# Patient Record
Sex: Female | Born: 1995 | Race: Black or African American | Hispanic: No | Marital: Single | Smoking: Never smoker
Health system: Southern US, Community
[De-identification: ages and names within clinical notes are randomized; demographics above are authoritative.]

## PROBLEM LIST (undated history)

## (undated) DIAGNOSIS — J45909 Unspecified asthma, uncomplicated: Secondary | ICD-10-CM

## (undated) HISTORY — DX: Unspecified asthma, uncomplicated: J45.909

## (undated) HISTORY — PX: WISDOM TOOTH EXTRACTION: SHX21

---

## 2020-10-01 ENCOUNTER — Other Ambulatory Visit: Payer: Self-pay

## 2021-04-23 DIAGNOSIS — Z3490 Encounter for supervision of normal pregnancy, unspecified, unspecified trimester: Secondary | ICD-10-CM | POA: Insufficient documentation

## 2021-04-25 ENCOUNTER — Ambulatory Visit (INDEPENDENT_AMBULATORY_CARE_PROVIDER_SITE_OTHER): Payer: Medicaid Other

## 2021-04-25 DIAGNOSIS — O219 Vomiting of pregnancy, unspecified: Secondary | ICD-10-CM

## 2021-04-25 DIAGNOSIS — Z3A00 Weeks of gestation of pregnancy not specified: Secondary | ICD-10-CM

## 2021-04-25 DIAGNOSIS — Z3490 Encounter for supervision of normal pregnancy, unspecified, unspecified trimester: Secondary | ICD-10-CM

## 2021-04-25 MED ORDER — BLOOD PRESSURE KIT DEVI: 1.0000 | 0 refills | Status: DC

## 2021-04-25 MED ORDER — GOJJI WEIGHT SCALE MISC: 1.0000 | 0 refills | Status: DC

## 2021-04-25 MED ORDER — PROMETHAZINE HCL 25 MG PO TABS: 25.0000 mg | ORAL_TABLET | Freq: Four times a day (QID) | ORAL | 0 refills | Status: DC | PRN

## 2021-04-25 NOTE — Progress Notes (Signed)
New OB Intake  I connected with  Dawn Small on 04/25/21 at  9:00 AM EDT by telephone Video Visit and verified that I am speaking with the correct person using two identifiers. Nurse is located at [facility] and pt is located at HOME.  I discussed the limitations, risks, security and privacy concerns of performing an evaluation and management service by telephone and the availability of in person appointments. I also discussed with the patient that there may be a patient responsible charge related to this service. The patient expressed understanding and agreed to proceed.  I explained I am completing New OB Intake today. We discussed her EDD of 10/22/2021 that is based on LMP of 01/15/2021. Pt is G1/P0. I reviewed her allergies, medications, Medical/Surgical/OB history, and appropriate screenings. I informed her of [facility] services. Based on history, this is a/an  pregnancy complicated by NV  .   Patient Active Problem List   Diagnosis Date Noted   Supervision of normal pregnancy, antepartum 04/23/2021    Concerns addressed today  Delivery Plans:  Plans to deliver at [facility].   MyChart/Babyscripts MyChart access verified. I explained pt will have some visits in office and some virtually. Babyscripts instructions given and order placed. Patient verifies receipt of registration text/e-mail. Account successfully created and app downloaded.  Blood Pressure Cuff  Blood pressure cuff ordered for patient to pick-up from [employer]. Explained after first prenatal appt pt will check weekly and document in Babyscripts.  Weight scale: Patient    have weight scale. Weight scale ordered for patient to pick up form [facility].   Anatomy US Explained first scheduled [place] will be around 19 weeks.  Labs Discussed Avelina Laine genetic screening with patient. Would like both Panorama and Horizon drawn at new OB visit. Routine prenatal labs needed.  Covid Vaccine Patient has covid vaccine.    Mother/ Baby Dyad Candidate?    If yes, offer as possibility  Informed patient of [facility] website  and placed link in her AVS.   Social Determinants of Health Food Insecurity: Patient denies food insecurity. WIC Referral: Patient is interested in referral to [facility].  Transportation: Patient denies transportation needs. Childcare: Discussed no children allowed at ultrasound appointments. Offered childcare services; patient declines childcare services at this time.   Placed OB Box on problem list and updated  First visit review I reviewed new OB appt with pt. I explained she will have a pelvic exam, ob bloodwork with genetic screening, and PAP smear. Explained pt will be seen by Dr. Debroah Loop 05/02/21 at first visit; encounter routed to appropriate provider. Explained that patient will be seen by pregnancy navigator following visit with provider. [facility] information placed in AVS.   Maretta Bees, RMA 04/25/2021  8:46 AM

## 2021-05-02 ENCOUNTER — Other Ambulatory Visit (HOSPITAL_COMMUNITY)
Admission: RE | Admit: 2021-05-02 | Discharge: 2021-05-02 | Disposition: A | Discharge status: Home / Self Care | Payer: Medicaid Other | Source: Ambulatory Visit | Attending: Obstetrics & Gynecology | Admitting: Obstetrics & Gynecology

## 2021-05-02 ENCOUNTER — Ambulatory Visit (INDEPENDENT_AMBULATORY_CARE_PROVIDER_SITE_OTHER): Payer: Medicaid Other | Admitting: Obstetrics & Gynecology

## 2021-05-02 ENCOUNTER — Other Ambulatory Visit: Payer: Self-pay

## 2021-05-02 VITALS — BP 130/84 | HR 116 | Wt 162.8 lb

## 2021-05-02 DIAGNOSIS — Z3A15 15 weeks gestation of pregnancy: Secondary | ICD-10-CM | POA: Diagnosis not present

## 2021-05-02 DIAGNOSIS — Z3492 Encounter for supervision of normal pregnancy, unspecified, second trimester: Secondary | ICD-10-CM

## 2021-05-02 DIAGNOSIS — Z3685 Encounter for antenatal screening for Streptococcus B: Secondary | ICD-10-CM | POA: Diagnosis not present

## 2021-05-02 DIAGNOSIS — Z31430 Encounter of female for testing for genetic disease carrier status for procreative management: Secondary | ICD-10-CM | POA: Diagnosis not present

## 2021-05-02 DIAGNOSIS — Z3490 Encounter for supervision of normal pregnancy, unspecified, unspecified trimester: Secondary | ICD-10-CM | POA: Diagnosis not present

## 2021-05-02 DIAGNOSIS — Z3482 Encounter for supervision of other normal pregnancy, second trimester: Secondary | ICD-10-CM | POA: Diagnosis not present

## 2021-05-02 MED ORDER — ASPIRIN EC 81 MG PO TBEC: 81.0000 mg | DELAYED_RELEASE_TABLET | Freq: Every day | ORAL | 11 refills | Status: DC

## 2021-05-02 NOTE — Progress Notes (Signed)
New OB 15.2wks Intake completed previously. No complaints.

## 2021-05-02 NOTE — Progress Notes (Signed)
  Subjective:    Dawn Small is a G1P0 [redacted]w[redacted]d being seen today for her first obstetrical visit.  Her obstetrical history is significant for  First pregnancy . Patient does not intend to breast feed. Pregnancy history fully reviewed.  Patient reports nausea.  Vitals:   05/02/21 0940  BP: 130/84  Pulse: (!) 116  Weight: 162 lb 12.8 oz (73.8 kg)    HISTORY: OB History  Gravida Para Term Preterm AB Living  1            SAB IAB Ectopic Multiple Live Births               # Outcome Date GA Lbr Len/2nd Weight Sex Delivery Anes PTL Lv  1 Current            Past Medical History:  Diagnosis Date  . Asthma    No past surgical history on file. Family History  Problem Relation Age of Onset  . Cancer Maternal Aunt      Exam    Uterus:   15 week  Pelvic Exam:    Perineum: No Hemorrhoids   Vulva: normal   Vagina:  curdlike discharge   pH:    Cervix: no lesions   Adnexa: normal adnexa   Bony Pelvis: average  System: Breast:  normal appearance, no masses or tenderness, piercings   Skin: normal coloration and turgor, no rashes    Neurologic: oriented, normal mood   Extremities: normal strength, tone, and muscle mass   HEENT PERRLA and oropharynx clear, no lesions   Mouth/Teeth mucous membranes moist, pharynx normal without lesions and dental hygiene good   Neck supple and no masses   Cardiovascular: regular rate and rhythm, no murmurs or gallops   Respiratory:  appears well, vitals normal, no respiratory distress, acyanotic, normal RR, neck free of mass or lymphadenopathy, chest clear, no wheezing, crepitations, rhonchi, normal symmetric air entry   Abdomen: soft, non-tender; bowel sounds normal; no masses,  no organomegaly   Urinary: urethral meatus normal      Assessment:    Pregnancy: G1P0 Patient Active Problem List   Diagnosis Date Noted  . Supervision of normal pregnancy, antepartum 04/23/2021        Plan:     Initial labs drawn. Prenatal  vitamins. Problem list reviewed and updated. Genetic Screening discussed : ordered  Ultrasound discussed; fetal survey: ordered.  Follow up in 4 weeks. 50% of 30 min visit spent on counseling and coordination of care.  Anatomy scan. ASA 81 mg BP 130.84  Scheryl Darter 05/02/2021

## 2021-05-03 LAB — CBC/D/PLT+RPR+RH+ABO+RUBIGG...
Antibody Screen: NEGATIVE
Basophils Absolute: 0 10*3/uL (ref 0.0–0.2)
Basos: 0 %
EOS (ABSOLUTE): 0.2 10*3/uL (ref 0.0–0.4)
Eos: 2 %
HCV Ab: <0.1 s/co ratio (ref 0.0–0.9)
HIV Screen 4th Generation wRfx: NONREACTIVE
Hematocrit: 33.9 % — ABNORMAL LOW (ref 34.0–46.6)
Hemoglobin: 9.7 g/dL — ABNORMAL LOW (ref 11.1–15.9)
Hepatitis B Surface Ag: NEGATIVE
Immature Grans (Abs): 0 10*3/uL (ref 0.0–0.1)
Immature Granulocytes: 0 %
Lymphocytes Absolute: 1.9 10*3/uL (ref 0.7–3.1)
Lymphs: 19 %
MCH: 18.7 pg — ABNORMAL LOW (ref 26.6–33.0)
MCHC: 28.6 g/dL — ABNORMAL LOW (ref 31.5–35.7)
MCV: 65 fL — ABNORMAL LOW (ref 79–97)
Monocytes Absolute: 0.9 10*3/uL (ref 0.1–0.9)
Monocytes: 9 %
Neutrophils Absolute: 6.9 10*3/uL (ref 1.4–7.0)
Neutrophils: 70 %
Platelets: 363 10*3/uL (ref 150–450)
RBC: 5.2 x10E6/uL (ref 3.77–5.28)
RDW: 20 % — ABNORMAL HIGH (ref 11.7–15.4)
RPR Ser Ql: NONREACTIVE
Rh Factor: POSITIVE
Rubella Antibodies, IGG: 3.21 index (ref >0.99)
WBC: 9.9 10*3/uL (ref 3.4–10.8)

## 2021-05-03 LAB — HEPATITIS C ANTIBODY: Hep C Virus Ab: <0.1 s/co ratio (ref 0.0–0.9)

## 2021-05-03 LAB — HCV INTERPRETATION

## 2021-05-05 LAB — CERVICOVAGINAL ANCILLARY ONLY
Bacterial Vaginitis (gardnerella): POSITIVE — AB
Candida Glabrata: NEGATIVE
Candida Vaginitis: POSITIVE — AB
Chlamydia: POSITIVE — AB
Comment: NEGATIVE
Comment: NEGATIVE
Comment: NEGATIVE
Comment: NEGATIVE
Comment: NEGATIVE
Comment: NORMAL
Neisseria Gonorrhea: NEGATIVE
Trichomonas: NEGATIVE

## 2021-05-05 LAB — CYTOLOGY - PAP: Diagnosis: NEGATIVE

## 2021-05-08 ENCOUNTER — Encounter (HOSPITAL_COMMUNITY): Payer: Self-pay | Admitting: Emergency Medicine

## 2021-05-08 ENCOUNTER — Other Ambulatory Visit: Payer: Self-pay | Admitting: Obstetrics & Gynecology

## 2021-05-08 ENCOUNTER — Inpatient Hospital Stay (HOSPITAL_COMMUNITY)
Admission: EM | Admit: 2021-05-08 | Discharge: 2021-05-09 | Disposition: A | Discharge status: Home / Self Care | Payer: Medicaid Other | Attending: Obstetrics & Gynecology | Admitting: Obstetrics & Gynecology

## 2021-05-08 ENCOUNTER — Other Ambulatory Visit: Payer: Self-pay

## 2021-05-08 DIAGNOSIS — O99282 Endocrine, nutritional and metabolic diseases complicating pregnancy, second trimester: Secondary | ICD-10-CM | POA: Diagnosis not present

## 2021-05-08 DIAGNOSIS — O2342 Unspecified infection of urinary tract in pregnancy, second trimester: Secondary | ICD-10-CM | POA: Diagnosis not present

## 2021-05-08 DIAGNOSIS — O211 Hyperemesis gravidarum with metabolic disturbance: Secondary | ICD-10-CM | POA: Diagnosis not present

## 2021-05-08 DIAGNOSIS — A749 Chlamydial infection, unspecified: Secondary | ICD-10-CM

## 2021-05-08 DIAGNOSIS — O26892 Other specified pregnancy related conditions, second trimester: Secondary | ICD-10-CM | POA: Insufficient documentation

## 2021-05-08 DIAGNOSIS — O219 Vomiting of pregnancy, unspecified: Secondary | ICD-10-CM | POA: Insufficient documentation

## 2021-05-08 DIAGNOSIS — E860 Dehydration: Secondary | ICD-10-CM | POA: Diagnosis not present

## 2021-05-08 DIAGNOSIS — Z3A16 16 weeks gestation of pregnancy: Secondary | ICD-10-CM | POA: Diagnosis not present

## 2021-05-08 DIAGNOSIS — N3001 Acute cystitis with hematuria: Secondary | ICD-10-CM | POA: Diagnosis not present

## 2021-05-08 DIAGNOSIS — R42 Dizziness and giddiness: Secondary | ICD-10-CM | POA: Insufficient documentation

## 2021-05-08 DIAGNOSIS — Z79899 Other long term (current) drug therapy: Secondary | ICD-10-CM | POA: Diagnosis not present

## 2021-05-08 DIAGNOSIS — O23592 Infection of other part of genital tract in pregnancy, second trimester: Secondary | ICD-10-CM | POA: Insufficient documentation

## 2021-05-08 DIAGNOSIS — Z3A15 15 weeks gestation of pregnancy: Secondary | ICD-10-CM | POA: Diagnosis not present

## 2021-05-08 LAB — URINE CULTURE, OB REFLEX

## 2021-05-08 LAB — URINALYSIS, ROUTINE W REFLEX MICROSCOPIC
Bilirubin Urine: NEGATIVE
Glucose, UA: NEGATIVE mg/dL
Hgb urine dipstick: NEGATIVE
Ketones, ur: 80 mg/dL — AB
Nitrite: NEGATIVE
Protein, ur: 100 mg/dL — AB
Specific Gravity, Urine: 1.025 (ref 1.005–1.030)
WBC, UA: >50 WBC/hpf — ABNORMAL HIGH (ref 0–5)
pH: 8 (ref 5.0–8.0)

## 2021-05-08 LAB — I-STAT BETA HCG BLOOD, ED (MC, WL, AP ONLY): I-stat hCG, quantitative: >2000 m[IU]/mL — ABNORMAL HIGH (ref <5)

## 2021-05-08 LAB — CULTURE, OB URINE

## 2021-05-08 MED ORDER — FLUCONAZOLE 150 MG PO TABS: 150.0000 mg | ORAL_TABLET | Freq: Once | ORAL | 0 refills | Status: DC

## 2021-05-08 MED ORDER — METRONIDAZOLE 500 MG PO TABS: 500.0000 mg | ORAL_TABLET | Freq: Two times a day (BID) | ORAL | 0 refills | Status: AC

## 2021-05-08 MED ORDER — AZITHROMYCIN 500 MG PO TABS: 1000.0000 mg | ORAL_TABLET | Freq: Once | ORAL | 0 refills | Status: DC

## 2021-05-08 NOTE — ED Triage Notes (Addendum)
Patient is [redacted] weeks pregnant G1P0 , reports feeling lightheaded/dizzy today and hypotensive 100/38. Denies abdominal cramping or vaginal bleeding . Normotensive at triage.

## 2021-05-08 NOTE — ED Provider Notes (Signed)
Emergency Medicine Provider OB Triage Evaluation Note  Dawn Small is a 25 y.o. female, G1P0, at [redacted]w[redacted]d gestation who presents to the emergency department with complaints of lightheadedness.  She took her blood pressure and noticed that her systolic pressures were about 711 earlier today.  She states I just want to know why." Review of  Systems  Positive: Lightheadedness Negative: Fever  Physical Exam  BP 120/80 (BP Location: Right Arm)   Pulse 97   Temp 98.3 F (36.8 C)   Resp 18   Ht 5\' 3"  (1.6 m)   Wt 80 kg   LMP 01/15/2021 (Approximate)   SpO2 100%   BMI 31.24 kg/m  General: Awake, no distress  HEENT: Atraumatic  Resp: Normal effort  Cardiac: Normal rate Abd: Nondistended, nontender  MSK: Moves all extremities without difficulty Neuro: Speech clear  Medical Decision Making  Pt evaluated for pregnancy concern and is stable for transfer to MAU. Pt is in agreement with plan for transfer.  8:30 PM Discussed with MAU APP, 01/17/2021, who accepts patient in transfer.  Clinical Impression  No diagnosis found.     Thalia Bloodgood, PA-C 05/08/21 2140    07/08/21, DO 05/08/21 2204

## 2021-05-08 NOTE — MAU Note (Signed)
Yesterday I had a headache and then today I felt dizzy all day. Was off balance and stumbling some. Has vomited several times today. Took nausea med (promethazine)this am and none since. No VB or dc. Intermittent pain RLQ for 3 days but none now.

## 2021-05-08 NOTE — ED Notes (Signed)
Report given to MAU RN , PA evaluated patient at ER .

## 2021-05-08 NOTE — Progress Notes (Signed)
Meds ordered this encounter  Medications   azithromycin (ZITHROMAX) 500 MG tablet    Sig: Take 2 tablets (1,000 mg total) by mouth once for 1 dose.    Dispense:  2 tablet    Refill:  0   fluconazole (DIFLUCAN) 150 MG tablet    Sig: Take 1 tablet (150 mg total) by mouth once for 1 dose.    Dispense:  1 tablet    Refill:  0   metroNIDAZOLE (FLAGYL) 500 MG tablet    Sig: Take 1 tablet (500 mg total) by mouth 2 (two) times daily for 7 days.    Dispense:  14 tablet    Refill:  0

## 2021-05-09 ENCOUNTER — Encounter (HOSPITAL_COMMUNITY): Payer: Self-pay | Admitting: Obstetrics & Gynecology

## 2021-05-09 DIAGNOSIS — Z3A16 16 weeks gestation of pregnancy: Secondary | ICD-10-CM | POA: Diagnosis not present

## 2021-05-09 DIAGNOSIS — O99282 Endocrine, nutritional and metabolic diseases complicating pregnancy, second trimester: Secondary | ICD-10-CM

## 2021-05-09 DIAGNOSIS — O2342 Unspecified infection of urinary tract in pregnancy, second trimester: Secondary | ICD-10-CM

## 2021-05-09 DIAGNOSIS — O211 Hyperemesis gravidarum with metabolic disturbance: Secondary | ICD-10-CM | POA: Diagnosis not present

## 2021-05-09 DIAGNOSIS — E860 Dehydration: Secondary | ICD-10-CM | POA: Diagnosis not present

## 2021-05-09 LAB — BASIC METABOLIC PANEL
Anion gap: 11 (ref 5–15)
BUN: 8 mg/dL (ref 6–20)
CO2: 23 mmol/L (ref 22–32)
Calcium: 9.2 mg/dL (ref 8.9–10.3)
Chloride: 100 mmol/L (ref 98–111)
Creatinine, Ser: 0.77 mg/dL (ref 0.44–1.00)
GFR, Estimated: >60 mL/min (ref >60)
Glucose, Bld: 119 mg/dL — ABNORMAL HIGH (ref 70–99)
Potassium: 3.7 mmol/L (ref 3.5–5.1)
Sodium: 134 mmol/L — ABNORMAL LOW (ref 135–145)

## 2021-05-09 LAB — CBC
HCT: 33.2 % — ABNORMAL LOW (ref 36.0–46.0)
Hemoglobin: 9.8 g/dL — ABNORMAL LOW (ref 12.0–15.0)
MCH: 19.1 pg — ABNORMAL LOW (ref 26.0–34.0)
MCHC: 29.5 g/dL — ABNORMAL LOW (ref 30.0–36.0)
MCV: 64.7 fL — ABNORMAL LOW (ref 80.0–100.0)
Platelets: 385 10*3/uL (ref 150–400)
RBC: 5.13 MIL/uL — ABNORMAL HIGH (ref 3.87–5.11)
RDW: 20.1 % — ABNORMAL HIGH (ref 11.5–15.5)
WBC: 14.7 10*3/uL — ABNORMAL HIGH (ref 4.0–10.5)
nRBC: 0 % (ref 0.0–0.2)

## 2021-05-09 MED ORDER — CEPHALEXIN 500 MG PO CAPS: 500.0000 mg | ORAL_CAPSULE | Freq: Four times a day (QID) | ORAL | 2 refills | Status: DC

## 2021-05-09 MED ORDER — ONDANSETRON HCL 4 MG/2ML IJ SOLN
4.0000 mg | Freq: Once | INTRAMUSCULAR | Status: AC
  Administered 2021-05-09: 4 mg via INTRAVENOUS
  Filled 2021-05-09: qty 2

## 2021-05-09 MED ORDER — FLUCONAZOLE 150 MG PO TABS: 150.0000 mg | ORAL_TABLET | Freq: Once | ORAL | 0 refills | Status: AC

## 2021-05-09 MED ORDER — ONDANSETRON 4 MG PO TBDP: 4.0000 mg | ORAL_TABLET | Freq: Four times a day (QID) | ORAL | 0 refills | Status: DC | PRN

## 2021-05-09 MED ORDER — SODIUM CHLORIDE 0.9 % IV SOLN
2.0000 g | Freq: Once | INTRAVENOUS | Status: AC
  Administered 2021-05-09: 2 g via INTRAVENOUS
  Filled 2021-05-09: qty 2

## 2021-05-09 MED ORDER — SODIUM CHLORIDE 0.9 % IV SOLN: INTRAVENOUS | Status: AC

## 2021-05-09 MED ORDER — AZITHROMYCIN 500 MG PO TABS
1000.0000 mg | ORAL_TABLET | Freq: Once | ORAL | 0 refills | Status: AC
Start: 2021-05-09 — End: 2021-05-09

## 2021-05-09 MED ORDER — LACTATED RINGERS IV SOLN: Freq: Once | INTRAVENOUS | Status: DC

## 2021-05-09 NOTE — MAU Provider Note (Signed)
Chief Complaint:  DizzyLlightheaded ([redacted] weeks pregnant)   Event Date/Time   First Provider Initiated Contact with Patient 05/09/21 0044     HPI: Dawn Small is a 25 y.o. G1P0 at 42w2dwho presents to maternity admissions reporting nausea and vomiting.  Took phenergan once in AM.  Tries to drink but vomits it up.   She denies LOF, vaginal bleeding, vaginal itching/burning, urinary symptoms, h/a, dizziness, diarrhea, constipation or fever/chills.  .  Emesis  This is a recurrent problem. The problem has been unchanged. There has been no fever. Associated symptoms include dizziness. Pertinent negatives include no abdominal pain, chills, coughing, diarrhea, fever or myalgias. Treatments tried: phenergan. The treatment provided mild relief.   RN Note: Yesterday I had a headache and then today I felt dizzy all day. Was off balance and stumbling some. Has vomited several times today. Took nausea med (promethazine)this am and none since. No VB or dc. Intermittent pain RLQ for 3 days but none now.   Past Medical History: Past Medical History:  Diagnosis Date   Asthma     Past obstetric history: OB History  Gravida Para Term Preterm AB Living  1            SAB IAB Ectopic Multiple Live Births               # Outcome Date GA Lbr Len/2nd Weight Sex Delivery Anes PTL Lv  1 Current             Past Surgical History: History reviewed. No pertinent surgical history.  Family History: Family History  Problem Relation Age of Onset   Cancer Maternal Aunt     Social History: Social History   Tobacco Use   Smoking status: Never   Smokeless tobacco: Never  Vaping Use   Vaping Use: Never used  Substance Use Topics   Alcohol use: Yes    Comment: rarely   Drug use: Never    Allergies: No Known Allergies  Meds:  Medications Prior to Admission  Medication Sig Dispense Refill Last Dose   [EXPIRED] azithromycin (ZITHROMAX) 500 MG tablet Take 2 tablets (1,000 mg total) by mouth once for 1  dose. 2 tablet 0    Blood Pressure Monitoring (BLOOD PRESSURE KIT) DEVI 1 kit by Does not apply route once a week. Check Blood Pressure regularly and record readings into the Babyscripts App.  Large Cuff.  DX O90.0 1 each 0    [EXPIRED] fluconazole (DIFLUCAN) 150 MG tablet Take 1 tablet (150 mg total) by mouth once for 1 dose. 1 tablet 0    metroNIDAZOLE (FLAGYL) 500 MG tablet Take 1 tablet (500 mg total) by mouth 2 (two) times daily for 7 days. 14 tablet 0    Misc. Devices (GOJJI WEIGHT SCALE) MISC 1 Device by Does not apply route once a week. 1 each 0    Prenatal Vit-Fe Fumarate-FA (MULTIVITAMIN-PRENATAL) 27-0.8 MG TABS tablet Take 1 tablet by mouth daily at 12 noon.      promethazine (PHENERGAN) 25 MG tablet Take 1 tablet (25 mg total) by mouth every 6 (six) hours as needed for nausea or vomiting. 30 tablet 0     I have reviewed patient's Past Medical Hx, Surgical Hx, Family Hx, Social Hx, medications and allergies.   ROS:  Review of Systems  Constitutional:  Negative for chills and fever.  Respiratory:  Negative for cough.   Gastrointestinal:  Positive for vomiting. Negative for abdominal pain and diarrhea.  Musculoskeletal:  Negative for myalgias.  Neurological:  Positive for dizziness.  Other systems negative  Physical Exam  Patient Vitals for the past 24 hrs:  BP Temp Pulse Resp SpO2 Height Weight  05/08/21 2204 115/74 98.3 F (36.8 C) 98 16 100 % $Rem'5\' 3"'rNVt$  (1.6 m) 72.6 kg  05/08/21 2014 120/80 98.3 F (36.8 C) 97 18 100 % -- --  05/08/21 2014 -- -- -- -- -- $Rem'5\' 3"'nNYm$  (1.6 m) 80 kg   Constitutional: Well-developed, well-nourished female in no acute distress.  Cardiovascular: normal rate and rhythm Respiratory: normal effort GI: Abd soft, non-tender, gravid appropriate for gestational age.   No rebound or guarding. MS: Extremities nontender, no edema, normal ROM Neurologic: Alert and oriented x 4.  GU: Neg CVAT.  PELVIC EXAM: deferred   FHT:  151   Labs: Results for orders  placed or performed during the hospital encounter of 05/08/21 (from the past 24 hour(s))  I-Stat beta hCG blood, ED     Status: Abnormal   Collection Time: 05/08/21  9:18 PM  Result Value Ref Range   I-stat hCG, quantitative >2,000.0 (H) <5 mIU/mL   Comment 3          Urinalysis, Routine w reflex microscopic Urine, Clean Catch     Status: Abnormal   Collection Time: 05/08/21 10:13 PM  Result Value Ref Range   Color, Urine AMBER (A) YELLOW   APPearance CLOUDY (A) CLEAR   Specific Gravity, Urine 1.025 1.005 - 1.030   pH 8.0 5.0 - 8.0   Glucose, UA NEGATIVE NEGATIVE mg/dL   Hgb urine dipstick NEGATIVE NEGATIVE   Bilirubin Urine NEGATIVE NEGATIVE   Ketones, ur 80 (A) NEGATIVE mg/dL   Protein, ur 100 (A) NEGATIVE mg/dL   Nitrite NEGATIVE NEGATIVE   Leukocytes,Ua LARGE (A) NEGATIVE   RBC / HPF 0-5 0 - 5 RBC/hpf   WBC, UA >50 (H) 0 - 5 WBC/hpf   Bacteria, UA MANY (A) NONE SEEN   Squamous Epithelial / LPF 11-20 0 - 5   Mucus PRESENT   Basic metabolic panel     Status: Abnormal   Collection Time: 05/09/21  1:00 AM  Result Value Ref Range   Sodium 134 (L) 135 - 145 mmol/L   Potassium 3.7 3.5 - 5.1 mmol/L   Chloride 100 98 - 111 mmol/L   CO2 23 22 - 32 mmol/L   Glucose, Bld 119 (H) 70 - 99 mg/dL   BUN 8 6 - 20 mg/dL   Creatinine, Ser 0.77 0.44 - 1.00 mg/dL   Calcium 9.2 8.9 - 10.3 mg/dL   GFR, Estimated >60 >60 mL/min   Anion gap 11 5 - 15  CBC     Status: Abnormal   Collection Time: 05/09/21  1:00 AM  Result Value Ref Range   WBC 14.7 (H) 4.0 - 10.5 K/uL   RBC 5.13 (H) 3.87 - 5.11 MIL/uL   Hemoglobin 9.8 (L) 12.0 - 15.0 g/dL   HCT 33.2 (L) 36.0 - 46.0 %   MCV 64.7 (L) 80.0 - 100.0 fL   MCH 19.1 (L) 26.0 - 34.0 pg   MCHC 29.5 (L) 30.0 - 36.0 g/dL   RDW 20.1 (H) 11.5 - 15.5 %   Platelets 385 150 - 400 K/uL   nRBC 0.0 0.0 - 0.2 %    A/Positive/-- (07/29 1054)  Imaging:  No results found.  MAU Course/MDM: I have ordered labs and reviewed results. These are normal except  for UA which indicated infection.  Already being treated for Chlamydia  and BV   Treatments in MAU included IV hydration with Zofran. We also gave a dose of Rocephin for UTI .    Assessment: Single IUP at [redacted]w[redacted]d Nausea and vomiting Dehydration UTI  Plan: Discharge home Rx Keflex for UTI Rx Zofran for nausea Advance diet as tolerated Follow up in Office for prenatal visits  Encouraged to return if she develops worsening of symptoms, increase in pain, fever, or other concerning symptoms  Pt stable at time of discharge.  Hansel Feinstein CNM, MSN Certified Nurse-Midwife 05/09/2021 12:46 AM

## 2021-05-12 ENCOUNTER — Encounter: Payer: Self-pay | Admitting: Obstetrics & Gynecology

## 2021-05-15 ENCOUNTER — Encounter: Payer: Self-pay | Admitting: Obstetrics & Gynecology

## 2021-05-28 ENCOUNTER — Other Ambulatory Visit: Payer: Self-pay

## 2021-05-28 ENCOUNTER — Ambulatory Visit: Payer: Medicaid Other | Attending: Obstetrics & Gynecology

## 2021-05-28 ENCOUNTER — Other Ambulatory Visit: Payer: Self-pay | Admitting: Obstetrics & Gynecology

## 2021-05-28 DIAGNOSIS — Z3490 Encounter for supervision of normal pregnancy, unspecified, unspecified trimester: Secondary | ICD-10-CM | POA: Diagnosis not present

## 2021-05-30 ENCOUNTER — Encounter: Payer: Self-pay | Admitting: Obstetrics and Gynecology

## 2021-05-30 ENCOUNTER — Other Ambulatory Visit: Payer: Self-pay | Admitting: *Deleted

## 2021-05-30 ENCOUNTER — Ambulatory Visit (INDEPENDENT_AMBULATORY_CARE_PROVIDER_SITE_OTHER): Payer: Medicaid Other | Admitting: Obstetrics and Gynecology

## 2021-05-30 ENCOUNTER — Other Ambulatory Visit: Payer: Self-pay

## 2021-05-30 DIAGNOSIS — D563 Thalassemia minor: Secondary | ICD-10-CM

## 2021-05-30 DIAGNOSIS — Z362 Encounter for other antenatal screening follow-up: Secondary | ICD-10-CM

## 2021-05-30 DIAGNOSIS — Z3490 Encounter for supervision of normal pregnancy, unspecified, unspecified trimester: Secondary | ICD-10-CM

## 2021-05-30 NOTE — Progress Notes (Signed)
   PRENATAL VISIT NOTE  Subjective:  Dawn Small is a 25 y.o. [id number] at [redacted]w[redacted]d beilow-riskng seen today for ongoing prenatal care.  She is currently monitored for the following issues for this low-risk pregnancy and has Supervision of normal pregnancy, antepartum and Alpha thalassemia trait on their problem list.  Patient reports no complaints.  Contractions: Not present. Vag. Bleeding: None.   . Denies leaking of fluid.   The following portions of the patient's history were reviewed and updated as appropriate: allergies, current medications, past family history, past medical history, past social history, past surgical history and problem list.   Objective:   Vitals:   05/30/21 1045  BP: 111/65  Pulse: 92  Weight: 164 lb 12.8 oz (74.8 kg)    Fetal Status: Fetal Heart Rate (bpm): 150         General:  Alert, oriented and cooperative. Patient is in no acute distress.  Skin: Skin is warm and dry. No rash noted.   Cardiovascular: Normal heart rate noted  Respiratory: Normal respiratory effort, no problems with respiration noted  Abdomen: Soft, gravid, appropriate for gestational age.  Pain/Pressure: Present     Pelvic: Cervical exam deferred        Extremities: Normal range of motion.  Edema: None  Mental Status: Normal mood and affect. Normal behavior. Normal judgment and thought content.   Assessment and Plan:  Pregnancy: G5P0040 at [redacted]w[redacted]d 1. Encounter for supervision of normal pregnancy, antepartum, unspecified gravidity  Doing well  Patient left prior to AFP drawn   2. Alpha thalassemia trait  Discussed results in detail Offered genetic counselor- patient declined.   Preterm labor symptoms and general obstetric precautions including but not limited to vaginal bleeding, contractions, leaking of fluid and fetal movement were reviewed in detail with the patient. Please refer to After Visit Summary for other counseling recommendations.   No follow-ups on file.  Future  Appointments  Date Time Provider Department Center  06/25/2021 12:30 PM [facility] NURSE [facility]  06/25/2021 12:45 PM [facility]  06/27/2021 11:15 AM Constant, Gigi Gin, MD [facility] None    Venia Carbon, NP

## 2021-05-30 NOTE — Progress Notes (Signed)
Pt reports fetal movement, denies pain.  

## 2021-06-25 ENCOUNTER — Ambulatory Visit: Payer: Medicaid Other | Attending: Maternal & Fetal Medicine

## 2021-06-25 ENCOUNTER — Other Ambulatory Visit: Payer: Self-pay

## 2021-06-25 ENCOUNTER — Encounter: Payer: Self-pay | Admitting: *Deleted

## 2021-06-25 ENCOUNTER — Ambulatory Visit: Payer: Medicaid Other | Admitting: *Deleted

## 2021-06-25 VITALS — BP 104/62 | HR 102

## 2021-06-25 DIAGNOSIS — Z3490 Encounter for supervision of normal pregnancy, unspecified, unspecified trimester: Secondary | ICD-10-CM | POA: Diagnosis present

## 2021-06-25 DIAGNOSIS — D563 Thalassemia minor: Secondary | ICD-10-CM | POA: Diagnosis present

## 2021-06-25 DIAGNOSIS — Z362 Encounter for other antenatal screening follow-up: Secondary | ICD-10-CM | POA: Diagnosis not present

## 2021-06-25 DIAGNOSIS — Z3A23 23 weeks gestation of pregnancy: Secondary | ICD-10-CM

## 2021-06-25 DIAGNOSIS — O2622 Pregnancy care for patient with recurrent pregnancy loss, second trimester: Secondary | ICD-10-CM | POA: Diagnosis not present

## 2021-06-25 IMAGING — US US MFM OB FOLLOW-UP
1 series · 14 of 28 positions shown · non-contrast
Comparison: none

[Series 1: us mfm ob follow-up · 14 of 43 slices shown]
[im 2/43]
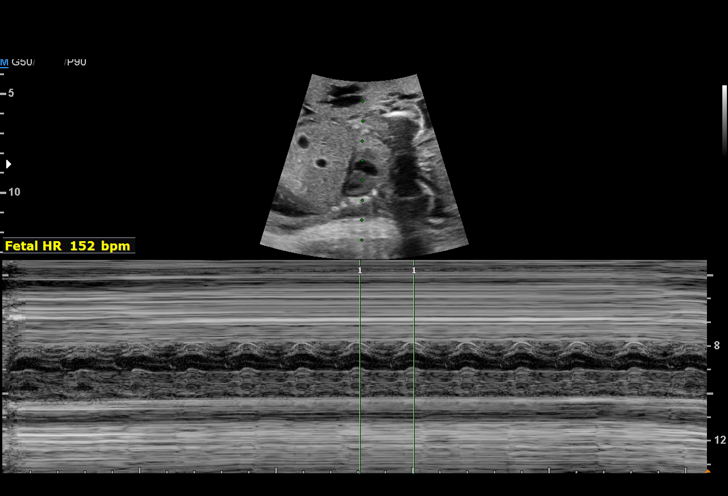
[im 5/43]
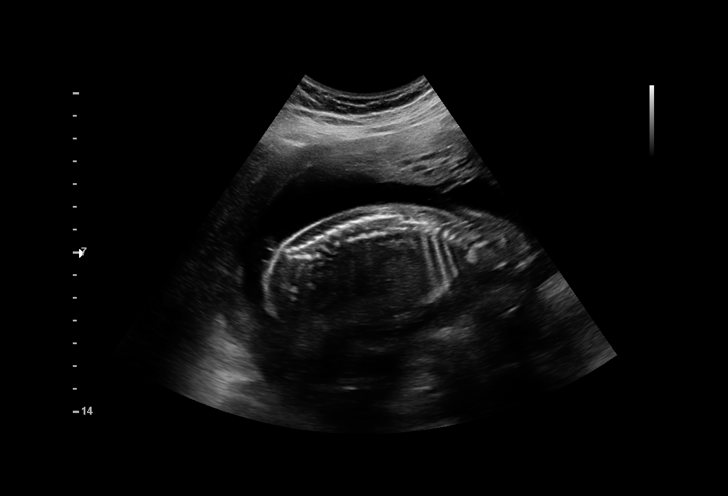
[im 8/43]
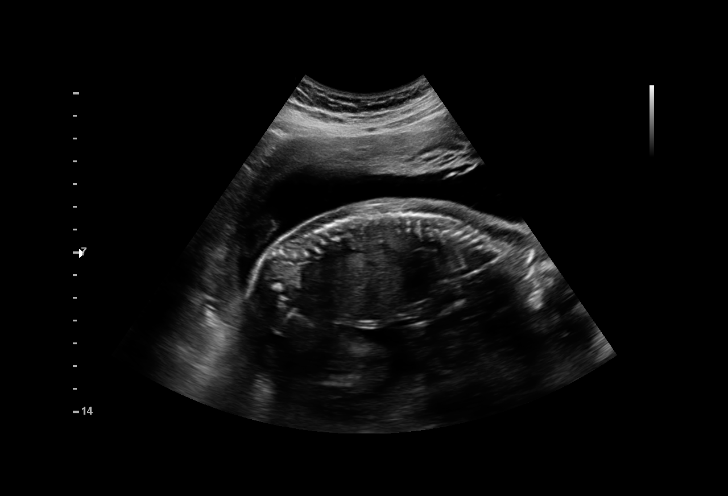
[im 11/43]
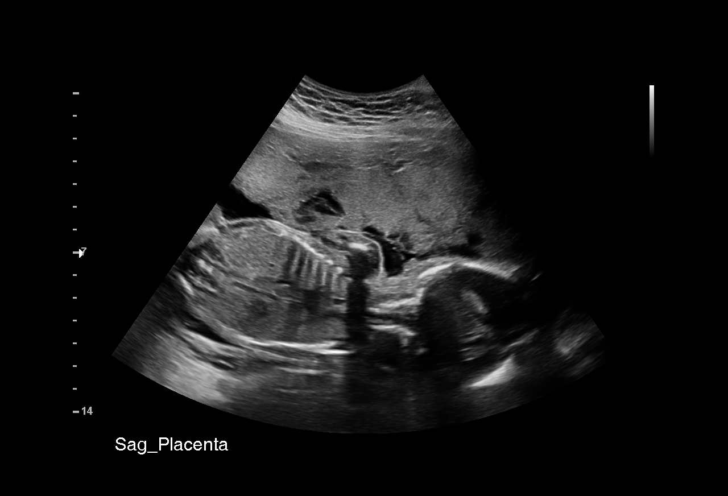
[im 15/43]
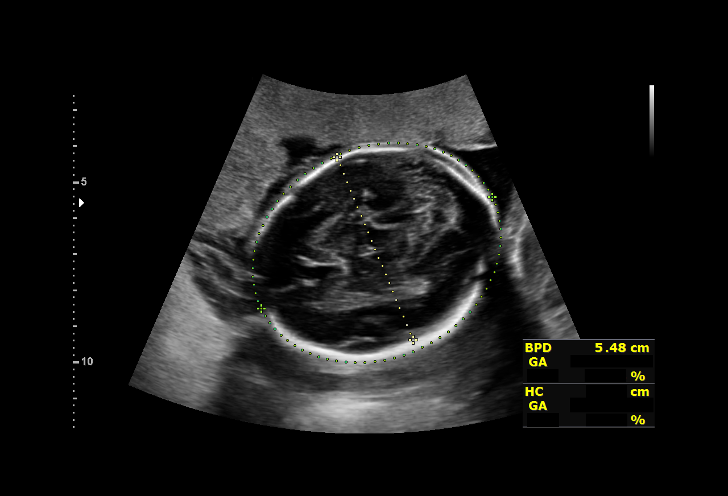
[im 18/43]
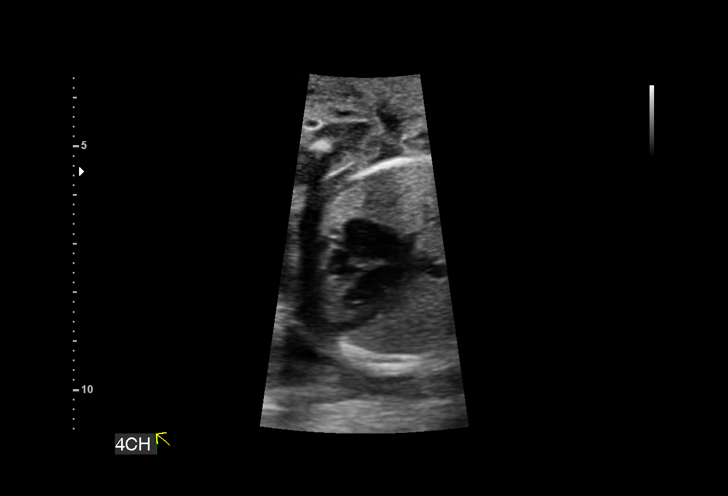
[im 21/43]
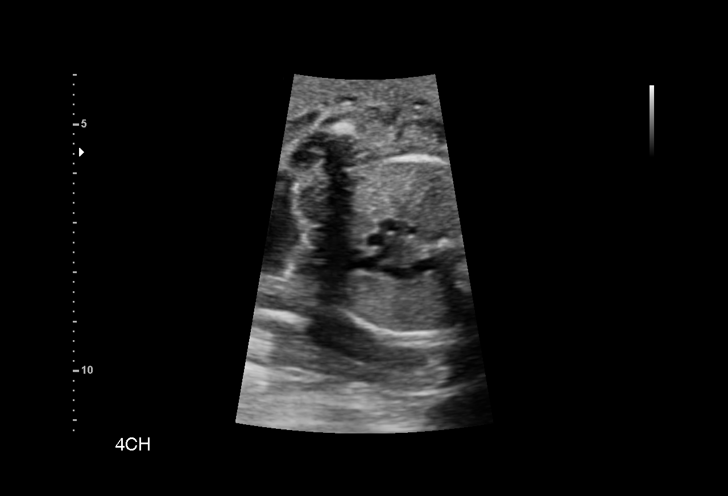
[im 24/43]
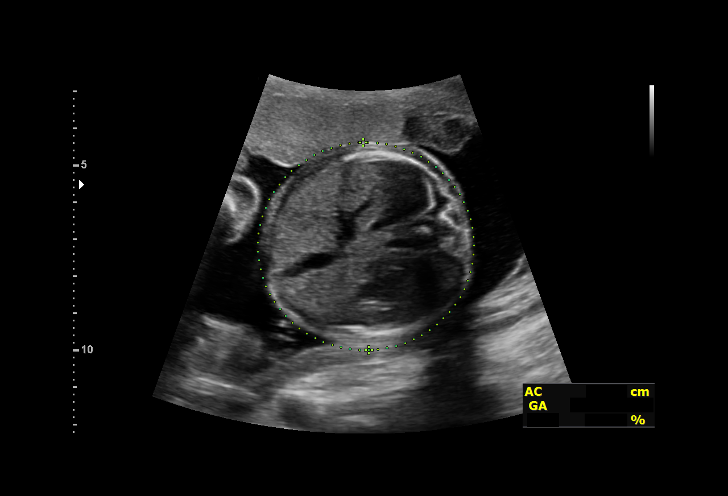
[im 27/43]
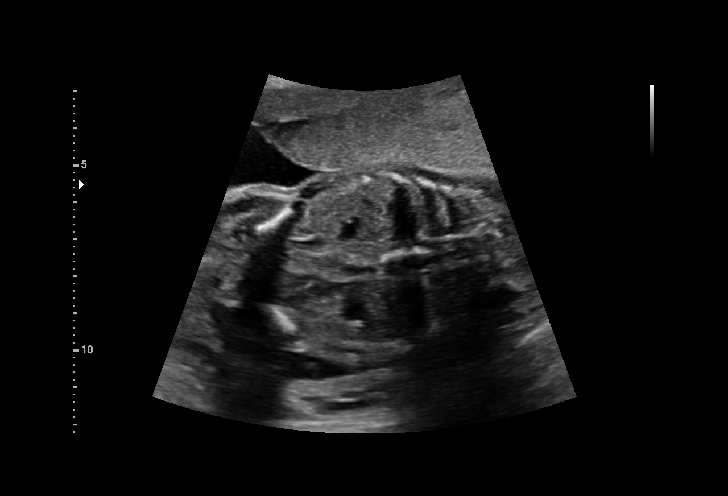
[im 30/43]
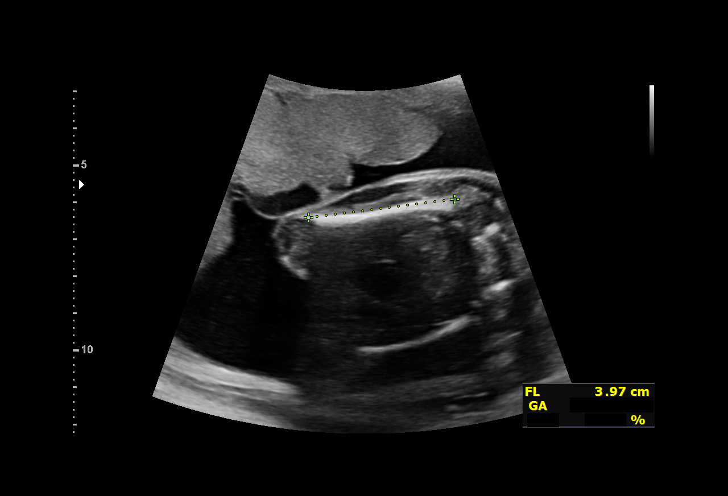
[im 33/43]
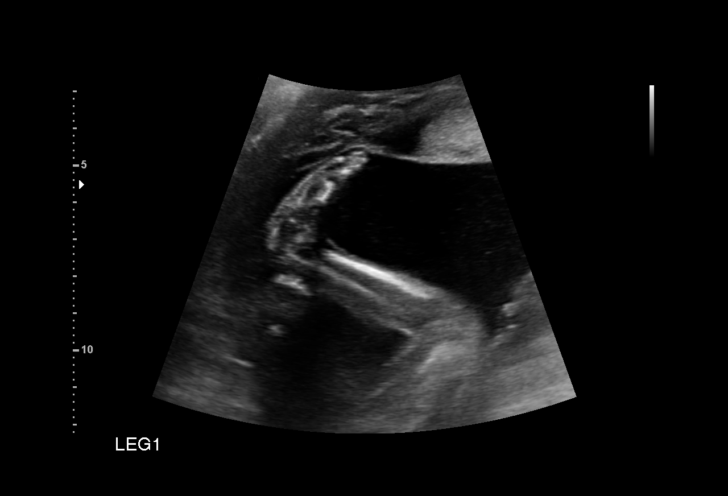
[im 36/43]
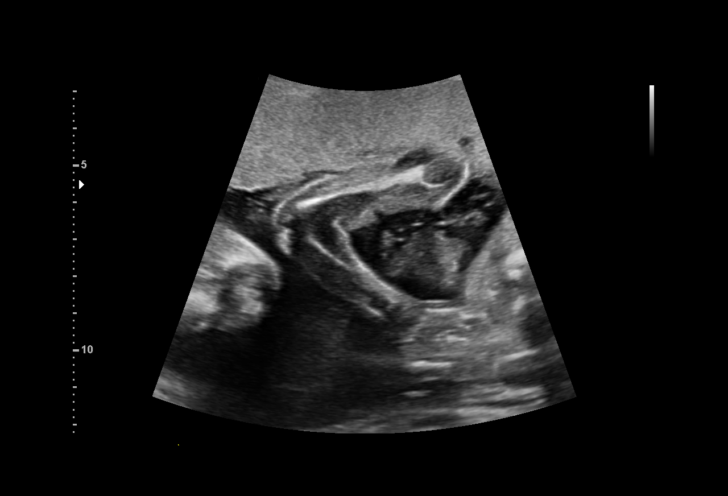
[im 39/43]
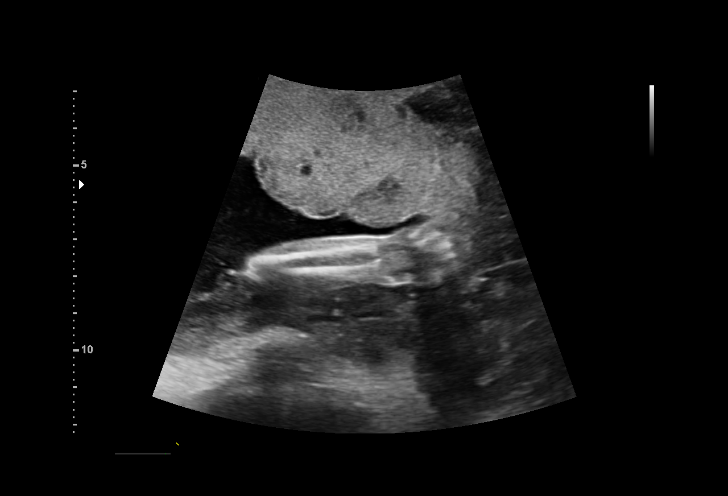
[im 43/43]
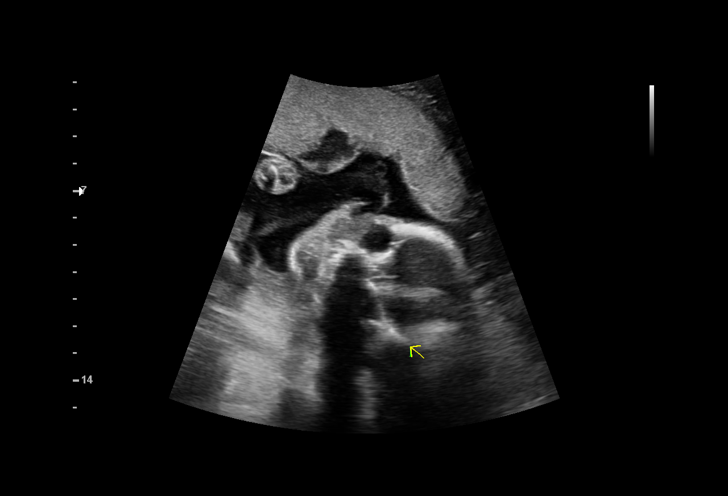

[14 of 28 positions shown; findings below may reference images not displayed]

MEEAN

Indications

 23 weeks gestation of pregnancy
 Poor obstetric history-Recurrent (habitual)    [XX]
 abortion (4 consecutive iab's)
 Genetic carrier (MEEAN)                   [XX]
 LR NIPS
Fetal Evaluation

 Num Of Fetuses:         1
 Fetal Heart Rate(bpm):  152
 Cardiac Activity:       Observed
 Presentation:           Cephalic
 Placenta:               Anterior
 P. Cord Insertion:      Previously Visualized

 Amniotic Fluid
 AFI FV:      Within normal limits

                             Largest Pocket(cm)

Biometry

 BPD:      54.9  mm     G. Age:  22w 5d         35  %    CI:        72.73   %    70 - 86
                                                         FL/HC:      19.3   %    19.2 -
 HC:      204.7  mm     G. Age:  22w 4d         20  %    HC/AC:      1.14        1.05 -
 AC:      179.4  mm     G. Age:  22w 6d         35  %    FL/BPD:     72.1   %    71 - 87
 FL:       39.6  mm     G. Age:  22w 5d         31  %    FL/AC:      22.1   %    20 - 24
 LV:        3.2  mm

 Est. FW:     532  gm      1 lb 3 oz     31  %
OB History

 Blood Type:   A+
 Gravidity:    5
 TOP:          4        Living:  0
Gestational Age

 LMP:           23w 0d        Date:  [DATE]                 EDD:   [DATE]
 U/S Today:     22w 5d                                        EDD:   [DATE]
 Best:          23w 0d     Det. By:  LMP  ([DATE])          EDD:   [DATE]
Anatomy

 Cranium:               Appears normal         Aortic Arch:            Previously seen
 Cavum:                 Previously seen        Ductal Arch:            Previously seen
 Ventricles:            Appears normal         Diaphragm:              Previously seen
 Choroid Plexus:        Previously seen        Stomach:                Appears normal, left
                                                                       sided
 Cerebellum:            Previously seen        Abdomen:                Previously seen
 Posterior Fossa:       Previously seen        Abdominal Wall:         Previously seen
 Nuchal Fold:           Previously seen        Cord Vessels:           Previously seen
 Face:                  Orbits and profile     Kidneys:                Appear normal
                        previously seen
 Lips:                  Previously seen        Bladder:                Appears normal
 Thoracic:              Previously seen        Spine:                  Appears normal
 Heart:                 Appears normal         Upper Extremities:      Appears normal
                        (4CH, axis, and
                        situs)
 RVOT:                  Previously seen        Lower Extremities:      Appears normal
 LVOT:                  Previously seen

 Other:  Fetus appears to be a male. BCV previously visualized. 3VV and
         3VTV appear normal
Cervix Uterus Adnexa

 Cervix
 Length:           3.34  cm.
 Not visualized (advanced GA >[XX])

 Right Ovary
 Visualized.

 Left Ovary
 Visualized.
Impression

 Patient returned for completion of fetal anatomy .Amniotic
 fluid is normal and good fetal activity is seen .Fetal biometry
 is consistent with her previously-established dates .Fetal
 anatomical survey was completed and appears normal.
Recommendations

 Follow-up scans as clinically indicated.
                 MEEAN

## 2021-06-27 ENCOUNTER — Encounter: Payer: Self-pay | Admitting: Obstetrics and Gynecology

## 2021-06-27 ENCOUNTER — Other Ambulatory Visit: Payer: Self-pay

## 2021-06-27 ENCOUNTER — Ambulatory Visit (INDEPENDENT_AMBULATORY_CARE_PROVIDER_SITE_OTHER): Payer: Medicaid Other | Admitting: Obstetrics and Gynecology

## 2021-06-27 DIAGNOSIS — Z3490 Encounter for supervision of normal pregnancy, unspecified, unspecified trimester: Secondary | ICD-10-CM

## 2021-06-27 NOTE — Progress Notes (Signed)
Pt reports fetal movement, denies pain.  

## 2021-06-27 NOTE — Progress Notes (Signed)
   PRENATAL VISIT NOTE  Subjective:  Dawn Small is a 25 y.o. G5P0040 at [redacted]w[redacted]d being seen today for ongoing prenatal care.  She is currently monitored for the following issues for this low-risk pregnancy and has Supervision of normal pregnancy, antepartum and Alpha thalassemia trait on their problem list.  Patient reports no complaints.  Contractions: Not present. Vag. Bleeding: None.  Movement: Present. Denies leaking of fluid.   The following portions of the patient's history were reviewed and updated as appropriate: allergies, current medications, past family history, past medical history, past social history, past surgical history and problem list.   Objective:   Vitals:   06/27/21 1058  BP: 106/69  Pulse: 99  Weight: 166 lb 4.8 oz (75.4 kg)    Fetal Status: Fetal Heart Rate (bpm): 145 Fundal Height: 23 cm Movement: Present     General:  Alert, oriented and cooperative. Patient is in no acute distress.  Skin: Skin is warm and dry. No rash noted.   Cardiovascular: Normal heart rate noted  Respiratory: Normal respiratory effort, no problems with respiration noted  Abdomen: Soft, gravid, appropriate for gestational age.  Pain/Pressure: Absent     Pelvic: Cervical exam deferred        Extremities: Normal range of motion.  Edema: None  Mental Status: Normal mood and affect. Normal behavior. Normal judgment and thought content.   Assessment and Plan:  Pregnancy: G5P0040 at [redacted]w[redacted]d 1. Encounter for supervision of normal pregnancy, antepartum, unspecified gravidity Patient is doing well without complaints Third trimester labs next visit with glucola  Preterm labor symptoms and general obstetric precautions including but not limited to vaginal bleeding, contractions, leaking of fluid and fetal movement were reviewed in detail with the patient. Please refer to After Visit Summary for other counseling recommendations.   Return in about 4 weeks (around 07/25/2021) for in person, ROB,  Low risk, 2 hr glucola next visit.  Future Appointments  Date Time Provider Department Center  06/27/2021 11:15 AM Zaida Reiland, Gigi Gin, MD [facility] None    Catalina Antigua, MD

## 2021-07-02 ENCOUNTER — Encounter: Payer: Self-pay | Admitting: Obstetrics

## 2021-07-25 ENCOUNTER — Ambulatory Visit (INDEPENDENT_AMBULATORY_CARE_PROVIDER_SITE_OTHER): Payer: Medicaid Other | Admitting: Obstetrics and Gynecology

## 2021-07-25 ENCOUNTER — Other Ambulatory Visit: Payer: Self-pay

## 2021-07-25 ENCOUNTER — Other Ambulatory Visit: Payer: Medicaid Other

## 2021-07-25 ENCOUNTER — Encounter: Payer: Self-pay | Admitting: Obstetrics and Gynecology

## 2021-07-25 ENCOUNTER — Encounter: Payer: Self-pay | Admitting: Obstetrics

## 2021-07-25 VITALS — BP 118/65 | HR 103 | Wt 170.0 lb

## 2021-07-25 DIAGNOSIS — Z23 Encounter for immunization: Secondary | ICD-10-CM | POA: Diagnosis not present

## 2021-07-25 DIAGNOSIS — Z3492 Encounter for supervision of normal pregnancy, unspecified, second trimester: Secondary | ICD-10-CM

## 2021-07-25 DIAGNOSIS — Z3490 Encounter for supervision of normal pregnancy, unspecified, unspecified trimester: Secondary | ICD-10-CM | POA: Diagnosis not present

## 2021-07-25 DIAGNOSIS — Z3A27 27 weeks gestation of pregnancy: Secondary | ICD-10-CM | POA: Diagnosis not present

## 2021-07-25 NOTE — Progress Notes (Signed)
   LOW-RISK PREGNANCY OFFICE VISIT Patient name: Dawn Small MRN [id number]  Date of birth: [birth year] Chief Complaint:   Routine Prenatal Visit  History of Present Illness:   Dawn Small is a 25 y.o. G58P0040 female at [redacted]w[redacted]d with an Estimated Date of Delivery: 10/22/21 being seen today for ongoing management of a low-risk pregnancy.  Today she reports no complaints. Contractions: Not present. Vag. Bleeding: None.  Movement: Present. denies leaking of fluid. Review of Systems:   Pertinent items are noted in HPI Denies abnormal vaginal discharge w/ itching/odor/irritation, headaches, visual changes, shortness of breath, chest pain, abdominal pain, severe nausea/vomiting, or problems with urination or bowel movements unless otherwise stated above. Pertinent History Reviewed:  Reviewed past medical,surgical, social, obstetrical and family history.  Reviewed problem list, medications and allergies. Physical Assessment:   Vitals:   07/25/21 0956  BP: 118/65  Pulse: (!) 103  Weight: 170 lb (77.1 kg)  Body mass index is 30.11 kg/m.        Physical Examination:   General appearance: Well appearing, and in no distress  Mental status: Alert, oriented to person, place, and time  Skin: Warm & dry  Cardiovascular: Normal heart rate noted  Respiratory: Normal respiratory effort, no distress  Abdomen: Soft, gravid, nontender  Pelvic: Cervical exam deferred         Extremities: Edema: None  Fetal Status: Fetal Heart Rate (bpm): 144   Movement: Present    No results found for this or any previous visit (from the past 24 hour(s)).  Assessment & Plan:  1) Low-risk pregnancy G5P0040 at [redacted]w[redacted]d with an Estimated Date of Delivery: 10/22/21   2) Encounter for supervision of normal pregnancy, antepartum, unspecified gravidity  - Glucose Tolerance, 2 Hours w/1 Hour,  - CBC,  - RPR,  - HIV Antibody (routine testing w rflx),  - Tdap vaccine greater than or equal to 7yo IM - Patient received flu  vaccine from work at beginning of October  3) [redacted] weeks gestation of pregnancy     Meds: No orders of the defined types were placed in this encounter.  Labs/procedures today: 2 hr GTT, 3rd trimester labs, TdaP  Plan:  Continue routine obstetrical care   Reviewed: Preterm labor symptoms and general obstetric precautions including but not limited to vaginal bleeding, contractions, leaking of fluid and fetal movement were reviewed in detail with the patient.  All questions were answered. Has home bp cuff. Check bp weekly, let us know if >140/90.   Follow-up: Return in about 4 weeks (around 08/22/2021) for Return OB visit.  Orders Placed This Encounter  Procedures   Tdap vaccine greater than or equal to 7yo IM   Glucose Tolerance, 2 Hours w/1 Hour   CBC   RPR   HIV Antibody (routine testing w rflx)   Raelyn Mora MSN, CNM 07/25/2021 10:28 AM

## 2021-07-25 NOTE — Progress Notes (Signed)
+   Fetal movement. No complaints. PHQ 9: 12, GAD 7: 4

## 2021-07-26 LAB — GLUCOSE TOLERANCE, 2 HOURS W/ 1HR
Glucose, 1 hour: 134 mg/dL (ref 70–179)
Glucose, 2 hour: 133 mg/dL (ref 70–152)
Glucose, Fasting: 74 mg/dL (ref 70–91)

## 2021-07-26 LAB — CBC
Hematocrit: 25.9 % — ABNORMAL LOW (ref 34.0–46.6)
Hemoglobin: 7.2 g/dL — ABNORMAL LOW (ref 11.1–15.9)
MCH: 17.2 pg — ABNORMAL LOW (ref 26.6–33.0)
MCHC: 27.8 g/dL — ABNORMAL LOW (ref 31.5–35.7)
MCV: 62 fL — ABNORMAL LOW (ref 79–97)
Platelets: 315 10*3/uL (ref 150–450)
RBC: 4.18 x10E6/uL (ref 3.77–5.28)
RDW: 17.9 % — ABNORMAL HIGH (ref 11.7–15.4)
WBC: 10.6 10*3/uL (ref 3.4–10.8)

## 2021-07-26 LAB — RPR: RPR Ser Ql: NONREACTIVE

## 2021-07-26 LAB — HIV ANTIBODY (ROUTINE TESTING W REFLEX): HIV Screen 4th Generation wRfx: NONREACTIVE

## 2021-07-29 ENCOUNTER — Other Ambulatory Visit: Payer: Self-pay | Admitting: Obstetrics and Gynecology

## 2021-07-29 ENCOUNTER — Encounter: Payer: Self-pay | Admitting: Obstetrics and Gynecology

## 2021-07-29 ENCOUNTER — Other Ambulatory Visit: Payer: Self-pay | Admitting: Obstetrics & Gynecology

## 2021-07-29 DIAGNOSIS — D509 Iron deficiency anemia, unspecified: Secondary | ICD-10-CM

## 2021-07-29 MED ORDER — ASCORBIC ACID 500 MG PO TABS: 500.0000 mg | ORAL_TABLET | ORAL | 3 refills | Status: DC

## 2021-07-29 MED ORDER — FERROUS SULFATE 325 (65 FE) MG PO TBEC: 325.0000 mg | DELAYED_RELEASE_TABLET | ORAL | 2 refills | Status: DC

## 2021-08-21 NOTE — Progress Notes (Signed)
   PRENATAL VISIT NOTE  Subjective:  Dawn Small is a 25 y.o. G5P0040 at [redacted]w[redacted]d being seen today for ongoing prenatal care.  She is currently monitored for the following issues for this low-risk pregnancy and has Supervision of normal pregnancy, antepartum; Alpha thalassemia trait; and Iron deficiency anemia of mother during pregnancy on their problem list.  Patient reports heartburn.  Contractions: Not present. Vag. Bleeding: None.  Movement: Present. Denies leaking of fluid.   The following portions of the patient's history were reviewed and updated as appropriate: allergies, current medications, past family history, past medical history, past social history, past surgical history and problem list.   Objective:   Vitals:   08/22/21 1030  BP: 125/70  Pulse: (!) 112  Weight: 176 lb 3.2 oz (79.9 kg)    Fetal Status: Fetal Heart Rate (bpm): 152   Movement: Present     General:  Alert, oriented and cooperative. Patient is in no acute distress.  Skin: Skin is warm and dry. No rash noted.   Cardiovascular: Normal heart rate noted  Respiratory: Normal respiratory effort, no problems with respiration noted  Abdomen: Soft, gravid, appropriate for gestational age.  Pain/Pressure: Absent     Pelvic: Cervical exam deferred        Extremities: Normal range of motion.  Edema: None  Mental Status: Normal mood and affect. Normal behavior. Normal judgment and thought content.   Assessment and Plan:  Pregnancy: G5P0040 at [redacted]w[redacted]d 1. Encounter for supervision of normal pregnancy, antepartum, unspecified gravidity - 28 wks labs normal except for the anemia.  - s/p flu and tdap - no indication for rhogam - Has reflux at night. Will send in Pepcid. She does tums but she is waking at night.   2. Iron deficiency anemia of mother during pregnancy - Will confirm with anemia panel, but clearly microcytic anemia. Possibly due to alpha thal trait.  - Confirmed PO iron compliance - If anemia panel shows  iron def, would do IV iron. Will order and arrange now for that probability.   3. Alpha thalassemia trait   Preterm labor symptoms and general obstetric precautions including but not limited to vaginal bleeding, contractions, leaking of fluid and fetal movement were reviewed in detail with the patient. Please refer to After Visit Summary for other counseling recommendations.   Return in about 2 weeks (around 09/05/2021) for OB VISIT, MD or APP.  No future appointments.   Milas Hock, MD

## 2021-08-22 ENCOUNTER — Other Ambulatory Visit: Payer: Self-pay

## 2021-08-22 ENCOUNTER — Other Ambulatory Visit: Payer: Self-pay | Admitting: Obstetrics and Gynecology

## 2021-08-22 ENCOUNTER — Ambulatory Visit (INDEPENDENT_AMBULATORY_CARE_PROVIDER_SITE_OTHER): Payer: Medicaid Other | Admitting: Obstetrics and Gynecology

## 2021-08-22 ENCOUNTER — Encounter: Payer: Self-pay | Admitting: Obstetrics and Gynecology

## 2021-08-22 VITALS — BP 125/70 | HR 112 | Wt 176.2 lb

## 2021-08-22 DIAGNOSIS — D509 Iron deficiency anemia, unspecified: Secondary | ICD-10-CM | POA: Diagnosis not present

## 2021-08-22 DIAGNOSIS — D563 Thalassemia minor: Secondary | ICD-10-CM | POA: Diagnosis not present

## 2021-08-22 DIAGNOSIS — O99019 Anemia complicating pregnancy, unspecified trimester: Secondary | ICD-10-CM | POA: Diagnosis not present

## 2021-08-22 DIAGNOSIS — Z3490 Encounter for supervision of normal pregnancy, unspecified, unspecified trimester: Secondary | ICD-10-CM

## 2021-08-23 LAB — FOLATE: Folate: 10.5 ng/mL (ref >3.0)

## 2021-08-23 LAB — FERRITIN: Ferritin: 3 ng/mL — ABNORMAL LOW (ref 15–150)

## 2021-08-23 LAB — IRON AND TIBC
Iron Saturation: 2 % — CL (ref 15–55)
Iron: 12 ug/dL — ABNORMAL LOW (ref 27–159)
Total Iron Binding Capacity: 524 ug/dL — ABNORMAL HIGH (ref 250–450)
UIBC: 512 ug/dL — ABNORMAL HIGH (ref 131–425)

## 2021-08-23 LAB — VITAMIN B12: Vitamin B-12: 321 pg/mL (ref 232–1245)

## 2021-09-01 ENCOUNTER — Other Ambulatory Visit: Payer: Self-pay | Admitting: Obstetrics and Gynecology

## 2021-09-03 ENCOUNTER — Encounter: Payer: Self-pay | Admitting: Obstetrics

## 2021-09-03 ENCOUNTER — Ambulatory Visit (INDEPENDENT_AMBULATORY_CARE_PROVIDER_SITE_OTHER): Payer: Medicaid Other | Admitting: Women's Health

## 2021-09-03 ENCOUNTER — Other Ambulatory Visit: Payer: Self-pay

## 2021-09-03 VITALS — BP 110/71 | HR 111 | Wt 175.0 lb

## 2021-09-03 DIAGNOSIS — O99019 Anemia complicating pregnancy, unspecified trimester: Secondary | ICD-10-CM

## 2021-09-03 DIAGNOSIS — Z3490 Encounter for supervision of normal pregnancy, unspecified, unspecified trimester: Secondary | ICD-10-CM

## 2021-09-03 DIAGNOSIS — D563 Thalassemia minor: Secondary | ICD-10-CM

## 2021-09-03 DIAGNOSIS — D509 Iron deficiency anemia, unspecified: Secondary | ICD-10-CM

## 2021-09-03 DIAGNOSIS — Z3A33 33 weeks gestation of pregnancy: Secondary | ICD-10-CM

## 2021-09-03 NOTE — Progress Notes (Signed)
Subjective:  Dawn Small is a 24 y.o. G5P0040 at [redacted]w[redacted]d being seen today for ongoing prenatal care.  She is currently monitored for the following issues for this low-risk pregnancy and has Supervision of normal pregnancy, antepartum; Alpha thalassemia trait; and Iron deficiency anemia of mother during pregnancy on their problem list.  Patient reports no complaints.  Contractions: Not present. Vag. Bleeding: None.  Movement: Present. Denies leaking of fluid.   The following portions of the patient's history were reviewed and updated as appropriate: allergies, current medications, past family history, past medical history, past social history, past surgical history and problem list. Problem list updated.  Objective:   Vitals:   09/03/21 1424  BP: 110/71  Pulse: (!) 111  Weight: 175 lb (79.4 kg)    Fetal Status: Fetal Heart Rate (bpm): 145 Fundal Height: 34 cm Movement: Present     General:  Alert, oriented and cooperative. Patient is in no acute distress.  Skin: Skin is warm and dry. No rash noted.   Cardiovascular: Normal heart rate noted  Respiratory: Normal respiratory effort, no problems with respiration noted  Abdomen: Soft, gravid, appropriate for gestational age. Pain/Pressure: Absent     Pelvic: Vag. Bleeding: None     Cervical exam deferred        Extremities: Normal range of motion.  Edema: None  Mental Status: Normal mood and affect. Normal behavior. Normal judgment and thought content.   Urinalysis:      Assessment and Plan:  Pregnancy: G5P0040 at [redacted]w[redacted]d  1. Encounter for supervision of normal pregnancy, antepartum, unspecified gravidity -discussed contraception, info given -CBE info given -GBS next visit -pt reports heartburn in pregnancy with no relief with TUMS, advised Pepcid, info given and safe meds list given  PHQ9 SCORE ONLY 07/25/2021 04/25/2021  PHQ-9 Total Score 12 2   GAD 7 : Generalized Anxiety Score 07/25/2021  Nervous, Anxious, on Edge 1   Control/stop worrying 1  Worry too much - different things 2  Trouble relaxing 0  Restless 0  Easily annoyed or irritable 0  Afraid - awful might happen 0  Total GAD 7 Score 4   2. Alpha thalassemia trait  3. Iron deficiency anemia of mother during pregnancy -Venofer scheduled 09/12/2021, pt aware CBC Latest Ref Rng & Units 07/25/2021 05/09/2021 05/02/2021  WBC 3.4 - 10.8 x10E3/uL 10.6 14.7(H) 9.9  Hemoglobin 11.1 - 15.9 g/dL 7.2(L) 9.8(L) 9.7(L)  Hematocrit 34.0 - 46.6 % 25.9(L) 33.2(L) 33.9(L)  Platelets 150 - 450 x10E3/uL 315 385 363   4. [redacted] weeks gestation of pregnancy  Preterm labor symptoms and general obstetric precautions including but not limited to vaginal bleeding, contractions, leaking of fluid and fetal movement were reviewed in detail with the patient. I discussed the assessment and treatment plan with the patient. The patient was provided an opportunity to ask questions and all were answered. The patient agreed with the plan and demonstrated an understanding of the instructions. The patient was advised to call back or seek an in-person office evaluation/go to [facility] at [facility] for any urgent or concerning symptoms. Please refer to After Visit Summary for other counseling recommendations.  Return in about 3 weeks (around 09/24/2021) for in-person LOB/GBS/cultures (cannot be scheduled before 09/24/2021).   Rion Catala, Odie Sera, NP

## 2021-09-03 NOTE — Patient Instructions (Addendum)
Maternity Assessment Unit (MAU)  The Maternity Assessment Unit (MAU) is located at the [facility] and [place] at [facility]. The address is: [address] [place], [place], [place] [address]. Please see map below for additional directions.    The Maternity Assessment Unit is designed to help you during your pregnancy, and for up to 6 weeks after delivery, with any pregnancy- or postpartum-related emergencies, if you think you are in labor, or if your water has broken. For example, if you experience nausea and vomiting, vaginal bleeding, severe abdominal or pelvic pain, elevated blood pressure or other problems related to your pregnancy or postpartum time, please come to the Maternity Assessment Unit for assistance.       Third Trimester of Pregnancy The third trimester of pregnancy is from week 28 through week 35. This is months 7 through 9. The third trimester is a time when the unborn baby (fetus) is growing rapidly. At the end of the ninth month, the fetus is about 20 inches long and weighs 6-10 pounds. Body changes during your third trimester During the third trimester, your body will continue to go through many changes. The changes vary and generally return to normal after your baby is born. Physical changes Your weight will continue to increase. You can expect to gain 25-35 pounds (11-16 kg) by the end of the pregnancy if you begin pregnancy at a normal weight. If you are underweight, you can expect to gain 28-40 lb (about 13-18 kg), and if you are overweight, you can expect to gain 15-25 lb (about 7-11 kg). You may begin to get stretch marks on your hips, abdomen, and breasts. Your breasts will continue to grow and may hurt. A yellow fluid (colostrum) may leak from your breasts. This is the first milk you are producing for your baby. You may have changes in your hair. These can include thickening of your hair, rapid growth, and changes in texture. Some people also  have hair loss during or after pregnancy, or hair that feels dry or thin. Your belly button may stick out. You may notice more swelling in your hands, face, or ankles. Health changes You may have heartburn. You may have constipation. You may develop hemorrhoids. You may develop swollen, bulging veins (varicose veins) in your legs. You may have increased body aches in the pelvis, back, or thighs. This is due to weight gain and increased hormones that are relaxing your joints. You may have increased tingling or numbness in your hands, arms, and legs. The skin on your abdomen may also feel numb. You may feel short of breath because of your expanding uterus. Other changes You may urinate more often because the fetus is moving lower into your pelvis and pressing on your bladder. You may have more problems sleeping. This may be caused by the size of your abdomen, an increased need to urinate, and an increase in your body's metabolism. You may notice the fetus "dropping," or moving lower in your abdomen (lightening). You may have increased vaginal discharge. You may notice that you have pain around your pelvic bone as your uterus distends. Follow these instructions at home: Medicines Follow your health care provider's instructions regarding medicine use. Specific medicines may be either safe or unsafe to take during pregnancy. Do not take any medicines unless approved by your health care provider. Take a prenatal vitamin that contains at least 600 micrograms (mcg) of folic acid. Eating and drinking Eat a healthy diet that includes fresh fruits  and vegetables, whole grains, good sources of protein such as meat, eggs, or tofu, and low-fat dairy products. Avoid raw meat and unpasteurized juice, milk, and cheese. These carry germs that can harm you and your baby. Eat 4 or 5 small meals rather than 3 large meals a day. You may need to take these actions to prevent or treat constipation: Drink enough  fluid to keep your urine pale yellow. Eat foods that are high in fiber, such as beans, whole grains, and fresh fruits and vegetables. Limit foods that are high in fat and processed sugars, such as fried or sweet foods. Activity Exercise only as directed by your health care provider. Most people can continue their usual exercise routine during pregnancy. Try to exercise for 30 minutes at least 5 days a week. Stop exercising if you experience contractions in the uterus. Stop exercising if you develop pain or cramping in the lower abdomen or lower back. Avoid heavy lifting. Do not exercise if it is very hot or humid or if you are at a high altitude. If you choose to, you may continue to have sex unless your health care provider tells you not to. Relieving pain and discomfort Take frequent breaks and rest with your legs raised (elevated) if you have leg cramps or low back pain. Take warm sitz baths to soothe any pain or discomfort caused by hemorrhoids. Use hemorrhoid cream if your health care provider approves. Wear a supportive bra to prevent discomfort from breast tenderness. If you develop varicose veins: Wear support hose as told by your health care provider. Elevate your feet for 15 minutes, 3-4 times a day. Limit salt in your diet. Safety Talk to your health care provider before traveling far distances. Do not use hot tubs, steam rooms, or saunas. Wear your seat belt at all times when driving or riding in a car. Talk with your health care provider if someone is verbally or physically abusive to you. Preparing for birth To prepare for the arrival of your baby: Take prenatal classes to understand, practice, and ask questions about labor and delivery. Visit the hospital and tour the maternity area. Purchase a rear-facing car seat and make sure you know how to install it in your car. Prepare the baby's room or sleeping area. Make sure to remove all pillows and stuffed animals from the  baby's crib to prevent suffocation. General instructions Avoid cat litter boxes and soil used by cats. These carry germs that can cause birth defects in the baby. If you have a cat, ask someone to clean the litter box for you. Do not douche or use tampons. Do not use scented sanitary pads. Do not use any products that contain nicotine or tobacco, such as cigarettes, e-cigarettes, and chewing tobacco. If you need help quitting, ask your health care provider. Do not use any herbal remedies, illegal drugs, or medicines that were not prescribed to you. Chemicals in these products can harm your baby. Do not drink alcohol. You will have more frequent prenatal exams during the third trimester. During a routine prenatal visit, your health care provider will do a physical exam, perform tests, and discuss your overall health. Keep all follow-up visits. This is important. Where to find more information American Pregnancy Association: americanpregnancy.[place] and Gynecologists: PoolDevices.com.pt Office on [employer] Health: KeywordPortfolios.com.br Contact a health care provider if you have: A fever. Mild pelvic cramps, pelvic pressure, or nagging pain in your abdominal area or lower back. Vomiting or diarrhea. Bad-smelling  vaginal discharge or foul-smelling urine. Pain when you urinate. A headache that does not go away when you take medicine. Visual changes or see spots in front of your eyes. Get help right away if: Your water breaks. You have regular contractions less than 5 minutes apart. You have spotting or bleeding from your vagina. You have severe abdominal pain. You have difficulty breathing. You have chest pain. You have fainting spells. You have not felt your baby move for the time period told by your health care provider. You have new or increased pain, swelling, or redness in an arm or leg. Summary The third trimester of pregnancy is  from week 28 through week 40 (months 7 through 9). You may have more problems sleeping. This can be caused by the size of your abdomen, an increased need to urinate, and an increase in your body's metabolism. You will have more frequent prenatal exams during the third trimester. Keep all follow-up visits. This is important. This information is not intended to replace advice given to you by your health care provider. Make sure you discuss any questions you have with your health care provider. Document Revised: 02/28/2020 Document Reviewed: 01/04/2020 Elsevier Patient Education  2022 [employer].       Contraception Choices - www.bedsider.org Contraception, also called birth control, refers to methods or devices that prevent pregnancy. Hormonal methods Contraceptive implant A contraceptive implant is a thin, plastic tube that contains a hormone that prevents pregnancy. It is different from an intrauterine device (IUD). It is inserted into the upper part of the arm by a health care provider. Implants can be effective for up to 3 years. Progestin-only injections Progestin-only injections are injections of progestin, a synthetic form of the hormone progesterone. They are given every 3 months by a health care provider. Birth control pills Birth control pills are pills that contain hormones that prevent pregnancy. They must be taken once a day, preferably at the same time each day. A prescription is needed to use this method of contraception. Birth control patch The birth control patch contains hormones that prevent pregnancy. It is placed on the skin and must be changed once a week for three weeks and removed on the fourth week. A prescription is needed to use this method of contraception. Vaginal ring A vaginal ring contains hormones that prevent pregnancy. It is placed in the vagina for three weeks and removed on the fourth week. After that, the process is repeated with a new ring. A  prescription is needed to use this method of contraception. Emergency contraceptive Emergency contraceptives prevent pregnancy after unprotected sex. They come in pill form and can be taken up to 5 days after sex. They work best the sooner they are taken after having sex. Most emergency contraceptives are available without a prescription. This method should not be used as your only form of birth control. Barrier methods Female condom A female condom is a thin sheath that is worn over the penis during sex. Condoms keep sperm from going inside a woman's body. They can be used with a sperm-killing substance (spermicide) to increase their effectiveness. They should be thrown away after one use. Female condom A female condom is a soft, loose-fitting sheath that is put into the vagina before sex. The condom keeps sperm from going inside a woman's body. They should be thrown away after one use. Diaphragm A diaphragm is a soft, dome-shaped barrier. It is inserted into the vagina before sex, along with a spermicide. The diaphragm  blocks sperm from entering the uterus, and the spermicide kills sperm. A diaphragm should be left in the vagina for 6-8 hours after sex and removed within 24 hours. A diaphragm is prescribed and fitted by a health care provider. A diaphragm should be replaced every 1-2 years, after giving birth, after gaining more than 15 lb (6.8 kg), and after pelvic surgery. Cervical cap A cervical cap is a round, soft latex or plastic cup that fits over the cervix. It is inserted into the vagina before sex, along with spermicide. It blocks sperm from entering the uterus. The cap should be left in place for 6-8 hours after sex and removed within 48 hours. A cervical cap must be prescribed and fitted by a health care provider. It should be replaced every 2 years. Sponge A sponge is a soft, circular piece of polyurethane foam with spermicide in it. The sponge helps block sperm from entering the uterus, and  the spermicide kills sperm. To use it, you make it wet and then insert it into the vagina. It should be inserted before sex, left in for at least 6 hours after sex, and removed and thrown away within 30 hours. Spermicides Spermicides are chemicals that kill or block sperm from entering the cervix and uterus. They can come as a cream, jelly, suppository, foam, or tablet. A spermicide should be inserted into the vagina with an applicator at least 10-15 minutes before sex to allow time for it to work. The process must be repeated every time you have sex. Spermicides do not require a prescription. Intrauterine contraception Intrauterine device (IUD) An IUD is a T-shaped device that is put in a woman's uterus. There are two types: Hormone IUD.This type contains progestin, a synthetic form of the hormone progesterone. This type can stay in place for 3-5 years. Copper IUD.This type is wrapped in copper wire. It can stay in place for 10 years. Permanent methods of contraception Female tubal ligation In this method, a woman's fallopian tubes are sealed, tied, or blocked during surgery to prevent eggs from traveling to the uterus. Hysteroscopic sterilization In this method, a small, flexible insert is placed into each fallopian tube. The inserts cause scar tissue to form in the fallopian tubes and block them, so sperm cannot reach an egg. The procedure takes about 3 months to be effective. Another form of birth control must be used during those 3 months. Female sterilization This is a procedure to tie off the tubes that carry sperm (vasectomy). After the procedure, the man can still ejaculate fluid (semen). Another form of birth control must be used for 3 months after the procedure. Natural planning methods Natural family planning In this method, a couple does not have sex on days when the woman could become pregnant. Calendar method In this method, the woman keeps track of the length of each menstrual cycle,  identifies the days when pregnancy can happen, and does not have sex on those days. Ovulation method In this method, a couple avoids sex during ovulation. Symptothermal method This method involves not having sex during ovulation. The woman typically checks for ovulation by watching changes in her temperature and in the consistency of cervical mucus. Post-ovulation method In this method, a couple waits to have sex until after ovulation. Where to find more information Centers for Disease Control and Prevention: FootballExhibition.com.br Summary Contraception, also called birth control, refers to methods or devices that prevent pregnancy. Hormonal methods of contraception include implants, injections, pills, patches, vaginal rings, and emergency  contraceptives. Barrier methods of contraception can include female condoms, female condoms, diaphragms, cervical caps, sponges, and spermicides. There are two types of IUDs (intrauterine devices). An IUD can be put in a woman's uterus to prevent pregnancy for 3-5 years. Permanent sterilization can be done through a procedure for males and females. Natural family planning methods involve nothaving sex on days when the woman could become pregnant. This information is not intended to replace advice given to you by your health care provider. Make sure you discuss any questions you have with your health care provider. Document Revised: 02/26/2020 Document Reviewed: 02/26/2020 Elsevier Patient Education  2022 Elsevier Inc.       Childbirth Education Options: [facility] Department Classes:  Childbirth education classes can help you get ready for a positive parenting experience. You can also meet other expectant parents and get free stuff for your baby. Each class runs for five weeks on the same night and costs $45 for the mother-to-be and her support person. Medicaid covers the cost if you are eligible. Call [phone] to register. [facility] Childbirth Education: Classes can vary in availability and schedule is subject to change. For most up-to-date information please visit [email or web address].com to review and register.             Preterm Labor The normal length of a pregnancy is 39-41 weeks. Preterm labor is when labor starts before 37 completed weeks of pregnancy. Babies who are born prematurely and survive may not be fully developed and may be at an increased risk for long-term problems such as cerebral palsy, developmental delays, and vision and hearing problems. Babies who are born too early may have problems soon after birth. Premature babies may have problems regulating blood sugar, body temperature, heart rate, and breathing rate. These babies often have trouble with feeding. The risk of having problems is highest for babies who are born before 34 weeks of pregnancy. What are the causes? The exact cause of this condition is not known. What increases the risk? You are more likely to have preterm labor if you have certain risk factors that relate to your medical history, problems with present and past pregnancies, and lifestyle factors. Medical history You have abnormalities of the uterus, including a short cervix. You have STIs (sexually transmitted infections) or other infections of the urinary tract and the vagina. You have chronic illnesses, such as blood clotting problems, diabetes, or high blood pressure. You are overweight or underweight. Present and past pregnancies You have had preterm labor before. You are pregnant with twins or other multiples. You have been diagnosed with a condition in which the placenta covers your cervix (placenta previa). You waited less than 18 months between giving birth and becoming pregnant again. Your unborn baby has some abnormalities. You have vaginal bleeding during pregnancy. You became pregnant through in vitro fertilization (IVF). Lifestyle and environmental  factors You use tobacco products or drink alcohol. You use drugs. You have stress and no social support. You experience domestic violence. You are exposed to certain chemicals or environmental pollutants. Other factors You are younger than age 42 or older than age 74. What are the signs or symptoms? Symptoms of this condition include: Cramps similar to those that can happen during a menstrual period. The cramps may happen with diarrhea. Pain in the abdomen or lower back. Regular contractions that may feel like tightening of the abdomen. A feeling of increased pressure in the pelvis. Increased watery or bloody mucus discharge from the  vagina. Water breaking (ruptured amniotic sac). How is this diagnosed? This condition is diagnosed based on: Your medical history and a physical exam. A pelvic exam. An ultrasound. Monitoring your uterus for contractions. Other tests, including: A swab of the cervix to check for a chemical called fetal fibronectin. Urine tests. How is this treated? Treatment for this condition depends on the length of your pregnancy, your condition, and the health of your baby. Treatment may include: Taking medicines, such as: Hormone medicines. These may be given early in pregnancy to help support the pregnancy. Medicines to stop contractions. Medicines to help mature the baby's lungs. These may be prescribed if the risk of delivery is high. Medicines to help protect your baby from brain and nerve complications such as cerebral palsy. Bed rest. If the labor happens before 34 weeks of pregnancy, you may need to stay in the hospital. Delivery of the baby. Follow these instructions at home:  Do not use any products that contain nicotine or tobacco. These products include cigarettes, chewing tobacco, and vaping devices, such as e-cigarettes. If you need help quitting, ask your health care provider. Do not drink alcohol. Take over-the-counter and prescription medicines  only as told by your health care provider. Rest as told by your health care provider. Return to your normal activities as told by your health care provider. Ask your health care provider what activities are safe for you. Keep all follow-up visits. This is important. How is this prevented? To increase your chance of having a full-term pregnancy: Do not use drugs or take medicines that have not been prescribed to you during your pregnancy. Talk with your health care provider before taking any herbal supplements, even if you have been taking them regularly. Make sure you gain a healthy amount of weight during your pregnancy. Watch for infection. If you think that you might have an infection, get it checked right away. Symptoms of infection may include: Fever. Abnormal vaginal discharge or discharge that smells bad. Pain or burning with urination. Needing to urinate urgently. Frequently urinating or passing small amounts of urine frequently. Blood in your urine or urine that smells bad or unusual. Where to find more information U.S. Department of Health and Cytogeneticist on Women's Health: http://hoffman.com/ [employer] of Obstetricians and Gynecologists: www.acog.org Centers for Disease Control and Prevention, Preterm Birth: FootballExhibition.com.br Contact a health care provider if: You think you are going into preterm labor. You have signs or symptoms of preterm labor. You have symptoms of infection. Get help right away if: You are having regular, painful contractions every 5 minutes or less. Your water breaks. Summary Preterm labor is labor that starts before you reach 37 weeks of pregnancy. Delivering your baby early increases your baby's risk of developing long-term problems. You are more likely to have preterm labor if you have certain risk factors that relate to your medical history, problems with present and past pregnancies, and lifestyle factors. Keep all follow-up visits.  This is important. Contact a health care provider if you have signs or symptoms of preterm labor. This information is not intended to replace advice given to you by your health care provider. Make sure you discuss any questions you have with your health care provider. Document Revised: 09/24/2020 Document Reviewed: 09/24/2020 Elsevier Patient Education  2022 Elsevier Inc.       Group B Streptococcus Test During Pregnancy Why am I having this test? Routine testing, also called screening, for group B streptococcus (GBS) is recommended for  all pregnant women between the 36th and 37th week of pregnancy. GBS is a type of bacteria that can be passed from mother to baby during childbirth. Screening will help guide whether or not you will need treatment during labor and delivery to prevent complications such as: An infection in your uterus during labor. An infection in your uterus after delivery. A serious infection in your baby after delivery, such as pneumonia, meningitis, or sepsis. GBS screening is not often done before 36 weeks of pregnancy unless you go into labor prematurely. What happens if I have group B streptococcus? If testing shows that you have GBS, your health care provider will recommend treatment with IV antibiotics during labor and delivery. This treatment significantly decreases the risk of complications for you and your baby. If you have a planned C-section and you have GBS, you may not need to be treated with antibiotics because GBS is usually passed to babies after labor starts and your water breaks. If you are in labor or your water breaks before your C-section, it is possible for GBS to get into your uterus and be passed to your baby, so you might need treatment. Is there a chance I may not need to be tested? You may not need to be tested for GBS if: You have a urine test that shows GBS before 36 to 37 weeks. You had a baby with GBS infection after a previous delivery. In  these cases, you will automatically be treated for GBS during labor and delivery. What is being tested? This test is done to check if you have group B streptococcus in your vagina or rectum. What kind of sample is taken? To collect samples for this test, your health care provider will swab your vagina and rectum with a cotton swab. The sample is then sent to the lab to see if GBS is present. What happens during the test?  You will remove your clothing from the waist down. You will lie down on an exam table in the same position as you would for a pelvic exam. Your health care provider will swab your vagina and rectum to collect samples for a culture test. You will be able to go home after the test and do all your usual activities. How are the results reported? The test results are reported as positive or negative. What do the results mean? A positive test means you are at risk for passing GBS to your baby during labor and delivery. Your health care provider will recommend that you are treated with an IV antibiotic during labor and delivery. A negative test means you are at very low risk of passing GBS to your baby. There is still a low risk of passing GBS to your baby because sometimes test results may report that you do not have a condition when you do (false-negative result) or there is a chance that you may become infected with GBS after the test is done. You most likely will not need to be treated with an antibiotic during labor and delivery. Talk with your health care provider about what your results mean. Questions to ask your health care provider Ask your health care provider, or the department that is doing the test: When will my results be ready? How will I get my results? What are my treatment options? Summary Routine testing (screening) for group B streptococcus (GBS) is recommended for all pregnant women between the 36th and 37th week of pregnancy. GBS is a type of bacteria that  can be passed from mother to baby during childbirth. If testing shows that you have GBS, your health care provider will recommend that you are treated with IV antibiotics during labor and delivery. This treatment almost always prevents infection in newborns. This information is not intended to replace advice given to you by your health care provider. Make sure you discuss any questions you have with your health care provider. Document Revised: 07/23/2020 Document Reviewed: 10/19/2018 Elsevier Patient Education  2022 Elsevier Inc.                        Safe Medications in Pregnancy    Acne: Benzoyl Peroxide Salicylic Acid  Backache/Headache: Tylenol: 2 regular strength every 4 hours OR              2 Extra strength every 6 hours  Colds/Coughs/Allergies: Benadryl (alcohol free) 25 mg every 6 hours as needed Breath right strips Claritin Cepacol throat lozenges Chloraseptic throat spray Cold-Eeze- up to three times per day Cough drops, alcohol free Flonase (by prescription only) Guaifenesin Mucinex Robitussin DM (plain only, alcohol free) Saline nasal spray/drops Sudafed (pseudoephedrine) & Actifed ** use only after [redacted] weeks gestation and if you do not have high blood pressure Tylenol Vicks Vaporub Zinc lozenges Zyrtec   Constipation: Colace Ducolax suppositories Fleet enema Glycerin suppositories Metamucil Milk of magnesia Miralax Senokot Smooth move tea  Diarrhea: Kaopectate Imodium A-D  *NO pepto Bismol  Hemorrhoids: Anusol Anusol HC Preparation H Tucks  Indigestion: Tums Maalox Mylanta Zantac  Pepcid  Insomnia: Benadryl (alcohol free)  every 6 hours as needed Tylenol PM Unisom, no Gelcaps  Leg Cramps: Tums MagGel  Nausea/Vomiting:  Bonine Dramamine Emetrol Ginger extract Sea bands Meclizine  Nausea medication to take during pregnancy:  Unisom (doxylamine succinate 25 mg tablets) Take one tablet daily at bedtime. If symptoms  are not adequately controlled, the dose can be increased to a maximum recommended dose of two tablets daily (1/2 tablet in the morning, 1/2 tablet mid-afternoon and one at bedtime). Vitamin B6  tablets. Take one tablet twice a day (up to 200 mg per day).  Skin Rashes: Aveeno products Benadryl cream or  every 6 hours as needed Calamine Lotion 1% cortisone cream  Yeast infection: Gyne-lotrimin 7 Monistat 7   **If taking multiple medications, please check labels to avoid duplicating the same active ingredients **take medication as directed on the label ** Do not exceed 4000 mg of tylenol in 24 hours **Do not take medications that contain aspirin or ibuprofen         Heartburn During Pregnancy Heartburn is a type of pain or discomfort that can happen in the throat or chest. It is often described as a burning sensation. Heartburn is common during pregnancy because: Progesterone, a hormone that is released during pregnancy, may relax the valve that separates the esophagus from the stomach (lower esophageal sphincter, or LES). This allows stomach acid to move up into the esophagus, causing heartburn. The uterus gets larger and pushes up on the stomach, which pushes more acid into the esophagus. This is especially true in the later stages of pregnancy. Heartburn usually goes away or gets better after giving birth. What are the causes? This condition is caused by stomach acid backing up into the esophagus (reflux). Reflux can be triggered by: Changing hormone levels. Large meals. Certain foods and beverages, such as coffee, chocolate, onions, and peppermint. Exercise. Increased stomach acid production. What increases the risk? You are more likely  to develop this condition if: You had heartburn prior to becoming pregnant. You have been pregnant more than once before. You are overweight or obese. The likelihood that you will get heartburn also increases as you get further  along in your pregnancy, especially during the last trimester. What are the signs or symptoms? Symptoms of this condition include: Burning pain in the chest or lower throat. A bitter taste in the mouth. Coughing. Problems swallowing. Vomiting. A hoarse voice. Asthma. Symptoms may get worse when you lie down or bend over. Symptoms are often worse at night. How is this diagnosed? This condition is diagnosed based on: Your medical history. Your symptoms. Blood tests to check for a certain type of bacteria that is associated with heartburn. Whether taking heartburn medicine relieves your symptoms. An examination of the stomach and esophagus using a tube that has a light and camera (endoscopy). How is this treated? Treatment for this condition depends on how severe your symptoms are. Your health care provider may recommend: Over-the-counter medicines for mild heartburn, such as antacids or acid reducers. Prescription medicines to decrease stomach acid or to protect your stomach lining. Certain changes in your diet. Raising the head of your bed so it is higher than the foot of the bed. This helps prevent stomach acid from backing up into the esophagus when you are lying down. Follow these instructions at home: Eating and drinking Do not drink alcohol during your pregnancy. Identify foods and beverages that make your symptoms worse and avoid them. Eat small, frequent meals instead of large meals. Avoid drinking large amounts of liquid with your meals. Avoid eating meals during the 2-3 hours before bedtime. Avoid lying down right after you eat. Do not exercise right after you eat. Beverages to avoid Coffee and tea, with or without caffeine. Energy drinks and sports drinks. Carbonated drinks or sodas. Citrus fruit juices. Foods to avoid Chocolate and cocoa. Peppermint and mint flavorings. Garlic, onions, and horseradish. Spicy and acidic foods, including peppers, chili powder, curry  powder, vinegar, hot sauces, and barbecue sauce. Citrus fruits, such as oranges, lemons, and limes. Tomato-based foods, such as red sauce, chili, and salsa. Fried and fatty foods, such as donuts, french fries, potato chips, and high-fat dressings. High-fat meats, such as hot dogs, precooked or cured meat, sausage, ham, and bacon. High-fat dairy items, such as whole milk, butter, and cheese. Medicines Take over-the-counter and prescription medicines only as told by your health care provider. Do not take aspirin or NSAIDs, such as ibuprofen, unless your health care provider tells you to take them. You may be instructed to avoid medicines that contain sodium bicarbonate. General instructions If directed, raise the head of your bed about 6 inches (15 cm) by putting blocks under the legs. Sleeping with more pillows does not effectively relieve heartburn because it only changes the position of your head. Do not use any products that contain nicotine or tobacco, such as cigarettes, e-cigarettes, and chewing tobacco. If you need help quitting, ask your health care provider. Wear loose-fitting clothing. Try to reduce your stress, such as with yoga or meditation. If you need help managing stress, ask your health care provider. Maintain a healthy weight. If you are overweight, work with your health care provider to safely manage your weight. Keep all follow-up visits as told by your health care provider. This is important. Where to find more information American Pregnancy Association: americanpregnancy.org Contact a health care provider if: Your symptoms do not improve with treatment, or you  develop new symptoms. You have unexplained weight loss. You have difficulty swallowing. You make loud sounds when you breathe (wheeze). You have a cough that does not go away. You have frequent heartburn for more than 2 weeks. You have nausea or vomiting that does not get better with treatment. You have pain in  your abdomen. Get help right away if: You have severe chest pain that spreads to your arm, neck, or jaw. You feel sweaty, dizzy, or light-headed. You have shortness of breath. You have pain when swallowing. You vomit, and your vomit looks like blood or coffee grounds. Your stool is bloody or black. Summary Heartburn in pregnancy is common, especially during the last trimester. This condition is caused by stomach acid backing up into the esophagus (reflux). This condition can be treated with medicines, changes to your diet, or elevating the head of your bed. Keep all follow-up visits as told by your health care provider. This is important. This information is not intended to replace advice given to you by your health care provider. Make sure you discuss any questions you have with your health care provider. Document Revised: 06/14/2019 Document Reviewed: 06/14/2019 Elsevier Patient Education  2022 [employer].

## 2021-09-12 ENCOUNTER — Ambulatory Visit (HOSPITAL_COMMUNITY)
Admission: RE | Admit: 2021-09-12 | Discharge: 2021-09-12 | Disposition: A | Discharge status: Home / Self Care | Payer: Medicaid Other | Source: Ambulatory Visit | Attending: Obstetrics & Gynecology | Admitting: Obstetrics & Gynecology

## 2021-09-12 ENCOUNTER — Other Ambulatory Visit: Payer: Self-pay

## 2021-09-12 DIAGNOSIS — O99019 Anemia complicating pregnancy, unspecified trimester: Secondary | ICD-10-CM | POA: Diagnosis present

## 2021-09-12 DIAGNOSIS — D509 Iron deficiency anemia, unspecified: Secondary | ICD-10-CM | POA: Insufficient documentation

## 2021-09-12 LAB — CBC
HCT: 27.7 % — ABNORMAL LOW (ref 36.0–46.0)
Hemoglobin: 7.4 g/dL — ABNORMAL LOW (ref 12.0–15.0)
MCH: 16 pg — ABNORMAL LOW (ref 26.0–34.0)
MCHC: 26.7 g/dL — ABNORMAL LOW (ref 30.0–36.0)
MCV: 59.8 fL — ABNORMAL LOW (ref 80.0–100.0)
Platelets: 298 10*3/uL (ref 150–400)
RBC: 4.63 MIL/uL (ref 3.87–5.11)
RDW: 21.8 % — ABNORMAL HIGH (ref 11.5–15.5)
WBC: 9.8 10*3/uL (ref 4.0–10.5)
nRBC: 0.4 % — ABNORMAL HIGH (ref 0.0–0.2)

## 2021-09-12 MED ORDER — EPINEPHRINE PF 1 MG/ML IJ SOLN: 0.3000 mg | Freq: Once | INTRAMUSCULAR | Status: DC | PRN

## 2021-09-12 MED ORDER — SODIUM CHLORIDE 0.9 % IV BOLUS: 500.0000 mL | Freq: Once | INTRAVENOUS | Status: DC | PRN

## 2021-09-12 MED ORDER — METHYLPREDNISOLONE SODIUM SUCC 125 MG IJ SOLR: 125.0000 mg | Freq: Once | INTRAMUSCULAR | Status: DC | PRN

## 2021-09-12 MED ORDER — SODIUM CHLORIDE 0.9 % IV SOLN: INTRAVENOUS | Status: DC | PRN

## 2021-09-12 MED ORDER — SODIUM CHLORIDE 0.9 % IV SOLN
500.0000 mg | Freq: Once | INTRAVENOUS | Status: AC
  Administered 2021-09-12: 500 mg via INTRAVENOUS
  Filled 2021-09-12: qty 25

## 2021-09-12 MED ORDER — ALBUTEROL SULFATE (2.5 MG/3ML) 0.083% IN NEBU: 2.5000 mg | INHALATION_SOLUTION | Freq: Once | RESPIRATORY_TRACT | Status: DC | PRN

## 2021-09-12 MED ORDER — DIPHENHYDRAMINE HCL 50 MG/ML IJ SOLN: 25.0000 mg | Freq: Once | INTRAMUSCULAR | Status: DC | PRN

## 2021-09-24 ENCOUNTER — Encounter: Payer: Self-pay | Admitting: Women's Health

## 2021-09-24 ENCOUNTER — Other Ambulatory Visit: Payer: Self-pay

## 2021-09-24 ENCOUNTER — Ambulatory Visit (INDEPENDENT_AMBULATORY_CARE_PROVIDER_SITE_OTHER): Payer: Medicaid Other | Admitting: Women's Health

## 2021-09-24 ENCOUNTER — Other Ambulatory Visit (HOSPITAL_COMMUNITY)
Admission: RE | Admit: 2021-09-24 | Discharge: 2021-09-24 | Disposition: A | Discharge status: Home / Self Care | Payer: Medicaid Other | Source: Ambulatory Visit | Attending: Women's Health | Admitting: Women's Health

## 2021-09-24 VITALS — BP 107/65 | HR 100 | Wt 178.3 lb

## 2021-09-24 DIAGNOSIS — Z3490 Encounter for supervision of normal pregnancy, unspecified, unspecified trimester: Secondary | ICD-10-CM

## 2021-09-24 DIAGNOSIS — O99019 Anemia complicating pregnancy, unspecified trimester: Secondary | ICD-10-CM | POA: Diagnosis not present

## 2021-09-24 DIAGNOSIS — D509 Iron deficiency anemia, unspecified: Secondary | ICD-10-CM | POA: Diagnosis not present

## 2021-09-24 DIAGNOSIS — Z3A36 36 weeks gestation of pregnancy: Secondary | ICD-10-CM

## 2021-09-24 NOTE — Patient Instructions (Signed)
Maternity Assessment Unit (MAU) ° °The Maternity Assessment Unit (MAU) is located at the [facility] at [facility]. The address is: [address], [address], [place], [place] [address]. Please see map below for additional directions. ° ° ° °The Maternity Assessment Unit is designed to help you during your pregnancy, and for up to 6 weeks after delivery, with any pregnancy- or postpartum-related emergencies, if you think you are in labor, or if your water has broken. For example, if you experience nausea and vomiting, vaginal bleeding, severe abdominal or pelvic pain, elevated blood pressure or other problems related to your pregnancy or postpartum time, please come to the Maternity Assessment Unit for assistance. ° ° ° ° ° ° °AREA PEDIATRIC/FAMILY PRACTICE PHYSICIANS ° °[facility] °[address] °[address] °[place], [place] [address] °Phone - [phone]   Fax - [phone] ° °[facility] °[address] °[place], [place]  [address] °Phone - [phone]   Fax - [phone] ° °[facility] °[address] °[place], [place]  [address] °Phone - [phone]   Fax - [phone] ° °[facility] °[address] °[place], [place]  [address] °Phone - [phone]   Fax - [phone] ° °[facility] °[address], [address] °[place], [place]  [address] °Phone - [phone]   Fax - [phone] ° °[facility] °[address] °[place], [place]  [address] °Phone - [phone]   Fax - [phone] ° °[facility] °[address] °[place], [place]  [address] °Phone - [phone]   Fax - [phone] ° °[facility] °[address] °[place], [place]  [address] °Phone - [phone]   Fax - [phone] ° °[facility] °[address] °[place], [place]  [address] °Phone - [phone]   Fax -  [phone] °[facility] °[address] °[place], [place]  [address] °Phone - [phone]   Fax - [phone] ° °[facility] °[address] °[place], [place]  [address] °Phone - [phone]   Fax - [phone] ° °[facility] °[address] °[place], [place]  [address] °Phone - [phone]   Fax - [phone] ° °[facility] °[address] °[place], [place]  [address] °Phone - [phone]   Fax - [phone] ° °[facility] °[address] °[place], [place]  [address] °Phone - [phone] ° °[facility] °[address] °[place], [place]  [address] °Phone - [phone]   Fax - [phone] ° °[facility] °[address] °[place], [place]  [address] °Phone - [phone]   Fax - [phone] ° °[facility] °[address] °[place], [place]  [address] °Phone - [phone]   Fax - [phone] ° °[facility] °[address] °[place], [place]  [address] °Phone - [phone]   Fax - [phone] ° ° °[facility] °[address] °[place], [place]  [address] °Phone - [phone]   Fax - [phone] ° °[facility] °[address] °[place], [place]  [address] °Phone - [phone]   Fax - [phone] ° °[facility] °[address] °[place], [place]  [address] °Phone - [phone]   Fax - [phone] ° °[facility] °[address] °[place], [place]  [address] °Phone - [phone]     Fax - [phone] ° °[facility] °[address] °[place], [place]  [address] °Phone - [phone]   Fax - [phone] °[facility] °[address] °[place], [place]  [address] °Phone - [phone]   Fax - [phone] ° °[facility] °[address] °[place], [place]  [address] °Phone - [phone]   Fax - [phone] ° °[facility] °[address] °[place], [place]  [address] °Phone - [phone]   Fax - [phone] ° ° ° ° ° ° ° ° °

## 2021-09-24 NOTE — Progress Notes (Signed)
Pt presents for ROB, GBS, GC/CT.  Pt reports no concerns at this time.  PHQ9=1 GAD7= 2 Venofer completed 09/12/21

## 2021-09-24 NOTE — Progress Notes (Signed)
Subjective:  Dawn Small is a 25 y.o. G5P0040 at [redacted]w[redacted]d being seen today for ongoing prenatal care.  She is currently monitored for the following issues for this low-risk pregnancy and has Supervision of normal pregnancy, antepartum; Alpha thalassemia trait; and Iron deficiency anemia of mother during pregnancy on their problem list.  Patient reports no complaints.  Contractions: Not present. Vag. Bleeding: None.  Movement: Present. Denies leaking of fluid.   The following portions of the patient's history were reviewed and updated as appropriate: allergies, current medications, past family history, past medical history, past social history, past surgical history and problem list. Problem list updated.  Objective:   Vitals:   09/24/21 1527  BP: 107/65  Pulse: 100  Weight: 178 lb 4.8 oz (80.9 kg)    Fetal Status: Fetal Heart Rate (bpm): 143   Movement: Present     General:  Alert, oriented and cooperative. Patient is in no acute distress.  Skin: Skin is warm and dry. No rash noted.   Cardiovascular: Normal heart rate noted  Respiratory: Normal respiratory effort, no problems with respiration noted  Abdomen: Soft, gravid, appropriate for gestational age. Pain/Pressure: Absent     Pelvic: Vag. Bleeding: None     Cervical exam deferred      Pt declines cervical exam.  Extremities: Normal range of motion.  Edema: None  Mental Status: Normal mood and affect. Normal behavior. Normal judgment and thought content.   Urinalysis:      Assessment and Plan:  Pregnancy: G5P0040 at [redacted]w[redacted]d  1. Encounter for supervision of normal pregnancy, antepartum, unspecified gravidity - Culture, beta strep (group b only) - Cervicovaginal ancillary only( [place]) - peds list given  2. Iron deficiency anemia of mother during pregnancy - CBC  3. [redacted] weeks gestation of pregnancy  Term labor symptoms and general obstetric precautions including but not limited to vaginal bleeding, contractions, leaking  of fluid and fetal movement were reviewed in detail with the patient. I discussed the assessment and treatment plan with the patient. The patient was provided an opportunity to ask questions and all were answered. The patient agreed with the plan and demonstrated an understanding of the instructions. The patient was advised to call back or seek an in-person office evaluation/go to MAU at [facility] for any urgent or concerning symptoms. Please refer to After Visit Summary for other counseling recommendations.  Return in about 1 week (around 10/01/2021) for in-person LOB/APP OK.   Brier Reid, Odie Sera, NP

## 2021-09-25 LAB — CBC
Hematocrit: 26.4 % — ABNORMAL LOW (ref 34.0–46.6)
Hemoglobin: 7.4 g/dL — ABNORMAL LOW (ref 11.1–15.9)
MCH: 17.3 pg — ABNORMAL LOW (ref 26.6–33.0)
MCHC: 28 g/dL — ABNORMAL LOW (ref 31.5–35.7)
MCV: 62 fL — ABNORMAL LOW (ref 79–97)
NRBC: 1 % — ABNORMAL HIGH (ref 0–0)
Platelets: 265 10*3/uL (ref 150–450)
RBC: 4.28 x10E6/uL (ref 3.77–5.28)
RDW: 27.7 % — ABNORMAL HIGH (ref 11.7–15.4)
WBC: 7 10*3/uL (ref 3.4–10.8)

## 2021-09-25 LAB — CERVICOVAGINAL ANCILLARY ONLY
Chlamydia: NEGATIVE
Comment: NEGATIVE
Comment: NORMAL
Neisseria Gonorrhea: NEGATIVE

## 2021-09-27 LAB — CULTURE, BETA STREP (GROUP B ONLY): Strep Gp B Culture: POSITIVE — AB

## 2021-09-30 ENCOUNTER — Encounter: Payer: Self-pay | Admitting: Nurse Practitioner

## 2021-09-30 DIAGNOSIS — O99820 Streptococcus B carrier state complicating pregnancy: Secondary | ICD-10-CM | POA: Insufficient documentation

## 2021-10-03 ENCOUNTER — Encounter: Payer: Self-pay | Admitting: Obstetrics and Gynecology

## 2021-10-04 ENCOUNTER — Other Ambulatory Visit: Payer: Self-pay

## 2021-10-04 ENCOUNTER — Encounter (HOSPITAL_COMMUNITY): Payer: Self-pay | Admitting: Obstetrics & Gynecology

## 2021-10-04 ENCOUNTER — Inpatient Hospital Stay (HOSPITAL_COMMUNITY)
Admission: AD | Admit: 2021-10-04 | Discharge: 2021-10-05 | Disposition: A | Discharge status: Home / Self Care | Payer: Medicaid Other | Source: Home / Self Care | Attending: Obstetrics & Gynecology | Admitting: Obstetrics & Gynecology

## 2021-10-04 DIAGNOSIS — O26893 Other specified pregnancy related conditions, third trimester: Secondary | ICD-10-CM | POA: Insufficient documentation

## 2021-10-04 DIAGNOSIS — D563 Thalassemia minor: Secondary | ICD-10-CM | POA: Insufficient documentation

## 2021-10-04 DIAGNOSIS — O368330 Maternal care for abnormalities of the fetal heart rate or rhythm, third trimester, not applicable or unspecified: Secondary | ICD-10-CM | POA: Insufficient documentation

## 2021-10-04 DIAGNOSIS — O368390 Maternal care for abnormalities of the fetal heart rate or rhythm, unspecified trimester, not applicable or unspecified: Secondary | ICD-10-CM

## 2021-10-04 DIAGNOSIS — R509 Fever, unspecified: Secondary | ICD-10-CM | POA: Insufficient documentation

## 2021-10-04 DIAGNOSIS — O99820 Streptococcus B carrier state complicating pregnancy: Secondary | ICD-10-CM | POA: Insufficient documentation

## 2021-10-04 DIAGNOSIS — O99013 Anemia complicating pregnancy, third trimester: Secondary | ICD-10-CM | POA: Insufficient documentation

## 2021-10-04 DIAGNOSIS — Z20822 Contact with and (suspected) exposure to covid-19: Secondary | ICD-10-CM | POA: Insufficient documentation

## 2021-10-04 DIAGNOSIS — D509 Iron deficiency anemia, unspecified: Secondary | ICD-10-CM | POA: Insufficient documentation

## 2021-10-04 DIAGNOSIS — Z3A37 37 weeks gestation of pregnancy: Secondary | ICD-10-CM | POA: Insufficient documentation

## 2021-10-04 DIAGNOSIS — R531 Weakness: Secondary | ICD-10-CM | POA: Insufficient documentation

## 2021-10-04 LAB — URINALYSIS, ROUTINE W REFLEX MICROSCOPIC
Bilirubin Urine: NEGATIVE
Glucose, UA: NEGATIVE mg/dL
Ketones, ur: NEGATIVE mg/dL
Nitrite: POSITIVE — AB
Protein, ur: 30 mg/dL — AB
Specific Gravity, Urine: 1.012 (ref 1.005–1.030)
WBC, UA: >50 WBC/hpf — ABNORMAL HIGH (ref 0–5)
pH: 5 (ref 5.0–8.0)

## 2021-10-04 LAB — COMPREHENSIVE METABOLIC PANEL
ALT: 15 U/L (ref 0–44)
AST: 19 U/L (ref 15–41)
Albumin: 2.7 g/dL — ABNORMAL LOW (ref 3.5–5.0)
Alkaline Phosphatase: 119 U/L (ref 38–126)
Anion gap: 9 (ref 5–15)
BUN: 6 mg/dL (ref 6–20)
CO2: 21 mmol/L — ABNORMAL LOW (ref 22–32)
Calcium: 8.6 mg/dL — ABNORMAL LOW (ref 8.9–10.3)
Chloride: 104 mmol/L (ref 98–111)
Creatinine, Ser: 0.76 mg/dL (ref 0.44–1.00)
GFR, Estimated: >60 mL/min (ref >60)
Glucose, Bld: 92 mg/dL (ref 70–99)
Potassium: 3.8 mmol/L (ref 3.5–5.1)
Sodium: 134 mmol/L — ABNORMAL LOW (ref 135–145)
Total Bilirubin: 1 mg/dL (ref 0.3–1.2)
Total Protein: 6.8 g/dL (ref 6.5–8.1)

## 2021-10-04 LAB — CBC
HCT: 28.5 % — ABNORMAL LOW (ref 36.0–46.0)
Hemoglobin: 7.9 g/dL — ABNORMAL LOW (ref 12.0–15.0)
MCH: 17.8 pg — ABNORMAL LOW (ref 26.0–34.0)
MCHC: 27.7 g/dL — ABNORMAL LOW (ref 30.0–36.0)
MCV: 64 fL — ABNORMAL LOW (ref 80.0–100.0)
Platelets: 307 10*3/uL (ref 150–400)
RBC: 4.45 MIL/uL (ref 3.87–5.11)
RDW: 28.5 % — ABNORMAL HIGH (ref 11.5–15.5)
WBC: 10.2 10*3/uL (ref 4.0–10.5)
nRBC: 0.3 % — ABNORMAL HIGH (ref 0.0–0.2)

## 2021-10-04 LAB — TYPE AND SCREEN
ABO/RH(D): A POS
Antibody Screen: NEGATIVE

## 2021-10-04 LAB — RESP PANEL BY RT-PCR (FLU A&B, COVID) ARPGX2
Influenza A by PCR: NEGATIVE
Influenza B by PCR: NEGATIVE
SARS Coronavirus 2 by RT PCR: NEGATIVE

## 2021-10-04 MED ORDER — CALCIUM CARBONATE ANTACID 500 MG PO CHEW: 2.0000 | CHEWABLE_TABLET | ORAL | Status: DC | PRN

## 2021-10-04 MED ORDER — LACTATED RINGERS IV BOLUS
1000.0000 mL | Freq: Once | INTRAVENOUS | Status: AC
  Administered 2021-10-04: 1000 mL via INTRAVENOUS

## 2021-10-04 MED ORDER — ZOLPIDEM TARTRATE 5 MG PO TABS: 5.0000 mg | ORAL_TABLET | Freq: Every evening | ORAL | Status: DC | PRN

## 2021-10-04 MED ORDER — ACETAMINOPHEN 325 MG PO TABS
650.0000 mg | ORAL_TABLET | ORAL | Status: DC | PRN
  Administered 2021-10-04: 650 mg via ORAL
  Filled 2021-10-04: qty 2

## 2021-10-04 MED ORDER — LACTATED RINGERS IV SOLN: INTRAVENOUS | Status: DC

## 2021-10-04 MED ORDER — PRENATAL MULTIVITAMIN CH: 1.0000 | ORAL_TABLET | Freq: Every day | ORAL | Status: DC

## 2021-10-04 MED ORDER — DOCUSATE SODIUM 100 MG PO CAPS
100.0000 mg | ORAL_CAPSULE | Freq: Every day | ORAL | Status: DC
  Administered 2021-10-04: 100 mg via ORAL
  Filled 2021-10-04: qty 1

## 2021-10-04 MED ORDER — ACETAMINOPHEN 500 MG PO TABS
1000.0000 mg | ORAL_TABLET | Freq: Once | ORAL | Status: AC
  Administered 2021-10-04: 1000 mg via ORAL
  Filled 2021-10-04: qty 2

## 2021-10-04 NOTE — H&P (Addendum)
ANTEPARTUM ADMISSION HISTORY AND PHYSICAL NOTE   History of Present Illness: Dawn Small is a 25 y.o. [id number] at 46w3dadmitted for observation due to fetal tachycardia.  Patient works at [employer]and got home from work around [employer]and then felt weak, feverish and had chills. She also noted that her baby was not moving as well. She denies vaginal bleeding, LOF, dysuria, NV. She endorses feeling some shortness of breath but reports that is normal for her.  Patient reports the fetal movement as decreased . Patient reports uterine contraction  activity as none. Patient reports  vaginal bleeding as none. Patient describes fluid per vagina as None. Fetal presentation is unsure.  Patient Active Problem List   Diagnosis Date Noted   Group B Streptococcus carrier, +RV culture, currently pregnant 09/30/2021   Iron deficiency anemia of mother during pregnancy 07/29/2021   Alpha thalassemia trait 05/30/2021   Supervision of normal pregnancy, antepartum 04/23/2021    Past Medical History:  Diagnosis Date   Asthma     Past Surgical History:  Procedure Laterality Date   WISDOM TOOTH EXTRACTION      OB History  Gravida Para Term Preterm AB Living  5       4    SAB IAB Ectopic Multiple Live Births    4          # Outcome Date GA Lbr Len/2nd Weight Sex Delivery Anes PTL Lv  5 Current           4 IAB           3 IAB           2 IAB           1 IAB             Social History   Socioeconomic History   Marital status: Single    Spouse name: Not on file   Number of children: Not on file   Years of education: Not on file   Highest education level: Not on file  Occupational History   Not on file  Tobacco Use   Smoking status: Never   Smokeless tobacco: Never  Vaping Use   Vaping Use: Never used  Substance and Sexual Activity   Alcohol use: Yes    Comment: rarely   Drug use: Never   Sexual activity: Yes  Other Topics Concern   Not on file  Social History Narrative   Not  on file   Social Determinants of Health   Financial Resource Strain: Not on file  Food Insecurity: Not on file  Transportation Needs: Not on file  Physical Activity: Not on file  Stress: Not on file  Social Connections: Not on file    Family History  Problem Relation Age of Onset   Cancer Maternal Aunt     No Known Allergies  Medications Prior to Admission  Medication Sig Dispense Refill Last Dose   ascorbic acid (VITAMIN C) 500 MG tablet Take 1 tablet (500 mg total) by mouth every other day. Take with iron pill 30 tablet 3    Blood Pressure Monitoring (BLOOD PRESSURE KIT) DEVI 1 kit by Does not apply route once a week. Check Blood Pressure regularly and record readings into the Bab[employer] App.  Large Cuff.  DX O90.0 1 each 0    ferrous sulfate 325 (65 FE) MG EC tablet Take 1 tablet (325 mg total) by mouth every other day. 30 tablet 2    Misc.  Devices (GOJJI WEIGHT SCALE) MISC 1 Device by Does not apply route once a week. 1 each 0    ondansetron (ZOFRAN ODT) 4 MG disintegrating tablet Take 1 tablet (4 mg total) by mouth every 6 (six) hours as needed for nausea. (Patient not taking: Reported on 07/25/2021) 20 tablet 0    Prenatal Vit-Fe Fumarate-FA (MULTIVITAMIN-PRENATAL) 27-0.8 MG TABS tablet Take 1 tablet by mouth daily at 12 noon.      promethazine (PHENERGAN) 25 MG tablet Take 1 tablet (25 mg total) by mouth every 6 (six) hours as needed for nausea or vomiting. (Patient not taking: Reported on 07/25/2021) 30 tablet 0     Review of Systems - Negative except overall chills, fever at home History obtained from the patient General ROS: positive for  - chills and fever Musculoskeletal ROS: positive for - chills  Vitals:  BP (!) 122/55    Pulse (!) 150    Temp (!) 101.8 F (38.8 C)    Ht 5' 3" (1.6 m)    Wt 79.8 kg    LMP 01/15/2021 (Approximate)    BMI 31.18 kg/m  Physical Examination: CONSTITUTIONAL: Well-developed, well-nourished female in no acute distress.  HENT:   Normocephalic, atraumatic, External right and left ear normal. Oropharynx is clear and moist EYES: Conjunctivae and EOM are normal. Pupils are equal, round, and reactive to light. No scleral icterus.  NECK: Normal range of motion, supple, no masses SKIN: Skin is warm and dry. No rash noted. Not diaphoretic. No erythema. No pallor. [place]: Alert and oriented to person, place, and time. Normal reflexes, muscle tone coordination. No cranial nerve deficit noted. PSYCHIATRIC: Normal mood and affect. Normal behavior. Normal judgment and thought content. CARDIOVASCULAR: Normal heart rate noted, regular rhythm RESPIRATORY: Effort and breath sounds normal, no problems with respiration noted ABDOMEN: Soft, nontender, nondistended, gravid. MUSCULOSKELETAL: Normal range of motion. No edema and no tenderness. 2+ distal pulses.  Cervix: Not evaluated. and found to be not evaluated/ not evaluated/ and fetal presentation is unsure. Membranes:intact Fetal Monitoring:Baseline: 180 with accelerations to 200 bpm; mod var, no decels Tocometer: Flat  Labs:  No results found for this or any previous visit (from the past 24 hour(s)).  Imaging Studies: No results found.   Assessment and Plan: Patient Active Problem List   Diagnosis Date Noted   Group B Streptococcus carrier, +RV culture, currently pregnant 09/30/2021   Iron deficiency anemia of mother during pregnancy 07/29/2021   Alpha thalassemia trait 05/30/2021   Supervision of normal pregnancy, antepartum 04/23/2021  -Viral infection suspected, respiratory panel pending  Patient with sustained fetal tachycardia in MAU.  -After consult with MFM, will admit to [facility] and monitor overnight.  -Routine antenatal care -Continuous monitoring -COVID test pending   Starr [place], [place], [facility]

## 2021-10-04 NOTE — Progress Notes (Signed)
Patient ID: Dawn Small, female   DOB: 1996/03/11, 25 y.o.   MRN: [id number] Asked by Dr. Charlotta Newton to check pt, admitted w/ fever, but having some mild contractions.  1/th/-3, vtx. Reactive NST, irregular mild uc's.  Cheral Marker, CNM, [facility] 10/04/2021 10:41 PM

## 2021-10-04 NOTE — MAU Provider Note (Signed)
[name] is a 25 y.o. female G5P0040 @ [redacted]w[redacted]d here in MAU complaining of Shivering and decreased fetal movement. Upon arrival Fetal tracing shows fetal tachycardia with HR in the 180's-200's, moderate variability, inability to determine fetal baseline, no acels, quick variables.  Dr. Charlotta Newton in MAU and reviewed fetal tracing. Dr. Parke Poisson with MFM called and discussed tracing with.  Tylenol 1 gram given PO, LR bolus infusing. Recommendations per Dr. Parke Poisson and Dr. Charlotta Newton is to continue aggressive IVF and admission for observation with the potential for delivery if no improvement in fetal status.    Venia Carbon I, NP 10/04/2021 8:09 PM

## 2021-10-04 NOTE — MAU Note (Addendum)
Pt reports since she got home from work she has been "shivering on and off." Stated her finger tips looked blue earlier tonight as well. Also felt at one point she may pass out but a little better now. Was told hr iron was low. C/o some braxton hicks ctx  pain mostly in her back. Also stated she has not felt baby move in a few hours

## 2021-10-05 ENCOUNTER — Inpatient Hospital Stay (HOSPITAL_COMMUNITY)
Admission: AD | Admit: 2021-10-05 | Discharge: 2021-10-17 | DRG: 786 | Disposition: A | Discharge status: Home / Self Care | Payer: Medicaid Other | Source: Ambulatory Visit | Attending: Obstetrics & Gynecology | Admitting: Obstetrics & Gynecology

## 2021-10-05 ENCOUNTER — Encounter (HOSPITAL_COMMUNITY): Payer: Self-pay | Admitting: Obstetrics and Gynecology

## 2021-10-05 ENCOUNTER — Encounter (HOSPITAL_COMMUNITY): Payer: Self-pay | Admitting: Certified Registered"

## 2021-10-05 ENCOUNTER — Other Ambulatory Visit: Payer: Self-pay

## 2021-10-05 DIAGNOSIS — D62 Acute posthemorrhagic anemia: Secondary | ICD-10-CM | POA: Diagnosis not present

## 2021-10-05 DIAGNOSIS — N179 Acute kidney failure, unspecified: Secondary | ICD-10-CM | POA: Diagnosis not present

## 2021-10-05 DIAGNOSIS — O99824 Streptococcus B carrier state complicating childbirth: Secondary | ICD-10-CM | POA: Diagnosis not present

## 2021-10-05 DIAGNOSIS — O411230 Chorioamnionitis, third trimester, not applicable or unspecified: Secondary | ICD-10-CM

## 2021-10-05 DIAGNOSIS — O9081 Anemia of the puerperium: Secondary | ICD-10-CM | POA: Diagnosis not present

## 2021-10-05 DIAGNOSIS — O99019 Anemia complicating pregnancy, unspecified trimester: Secondary | ICD-10-CM | POA: Diagnosis not present

## 2021-10-05 DIAGNOSIS — B9620 Unspecified Escherichia coli [E. coli] as the cause of diseases classified elsewhere: Secondary | ICD-10-CM | POA: Diagnosis present

## 2021-10-05 DIAGNOSIS — O2303 Infections of kidney in pregnancy, third trimester: Secondary | ICD-10-CM

## 2021-10-05 DIAGNOSIS — O99284 Endocrine, nutritional and metabolic diseases complicating childbirth: Secondary | ICD-10-CM | POA: Diagnosis not present

## 2021-10-05 DIAGNOSIS — E876 Hypokalemia: Secondary | ICD-10-CM | POA: Diagnosis not present

## 2021-10-05 DIAGNOSIS — R509 Fever, unspecified: Secondary | ICD-10-CM | POA: Diagnosis not present

## 2021-10-05 DIAGNOSIS — N12 Tubulo-interstitial nephritis, not specified as acute or chronic: Secondary | ICD-10-CM | POA: Diagnosis not present

## 2021-10-05 DIAGNOSIS — O904 Postpartum acute kidney failure: Secondary | ICD-10-CM | POA: Diagnosis not present

## 2021-10-05 DIAGNOSIS — D509 Iron deficiency anemia, unspecified: Secondary | ICD-10-CM | POA: Diagnosis present

## 2021-10-05 DIAGNOSIS — Z3A38 38 weeks gestation of pregnancy: Secondary | ICD-10-CM | POA: Diagnosis not present

## 2021-10-05 DIAGNOSIS — Z20822 Contact with and (suspected) exposure to covid-19: Secondary | ICD-10-CM | POA: Diagnosis present

## 2021-10-05 DIAGNOSIS — M549 Dorsalgia, unspecified: Secondary | ICD-10-CM | POA: Diagnosis present

## 2021-10-05 DIAGNOSIS — O2343 Unspecified infection of urinary tract in pregnancy, third trimester: Secondary | ICD-10-CM | POA: Diagnosis not present

## 2021-10-05 DIAGNOSIS — D563 Thalassemia minor: Secondary | ICD-10-CM | POA: Diagnosis present

## 2021-10-05 DIAGNOSIS — O9882 Other maternal infectious and parasitic diseases complicating childbirth: Secondary | ICD-10-CM | POA: Diagnosis not present

## 2021-10-05 DIAGNOSIS — Z3A37 37 weeks gestation of pregnancy: Secondary | ICD-10-CM | POA: Diagnosis not present

## 2021-10-05 DIAGNOSIS — O368390 Maternal care for abnormalities of the fetal heart rate or rhythm, unspecified trimester, not applicable or unspecified: Secondary | ICD-10-CM

## 2021-10-05 DIAGNOSIS — Z362 Encounter for other antenatal screening follow-up: Secondary | ICD-10-CM | POA: Diagnosis not present

## 2021-10-05 DIAGNOSIS — J90 Pleural effusion, not elsewhere classified: Secondary | ICD-10-CM | POA: Diagnosis not present

## 2021-10-05 DIAGNOSIS — O99013 Anemia complicating pregnancy, third trimester: Secondary | ICD-10-CM | POA: Diagnosis not present

## 2021-10-05 DIAGNOSIS — O9902 Anemia complicating childbirth: Secondary | ICD-10-CM | POA: Diagnosis not present

## 2021-10-05 DIAGNOSIS — R7401 Elevation of levels of liver transaminase levels: Secondary | ICD-10-CM | POA: Diagnosis not present

## 2021-10-05 DIAGNOSIS — O99891 Other specified diseases and conditions complicating pregnancy: Secondary | ICD-10-CM | POA: Diagnosis not present

## 2021-10-05 DIAGNOSIS — O2300 Infections of kidney in pregnancy, unspecified trimester: Secondary | ICD-10-CM | POA: Diagnosis present

## 2021-10-05 DIAGNOSIS — M7989 Other specified soft tissue disorders: Secondary | ICD-10-CM | POA: Diagnosis not present

## 2021-10-05 DIAGNOSIS — Z3A39 39 weeks gestation of pregnancy: Secondary | ICD-10-CM | POA: Diagnosis not present

## 2021-10-05 LAB — TYPE AND SCREEN
ABO/RH(D): A POS
Antibody Screen: NEGATIVE

## 2021-10-05 MED ORDER — ACETAMINOPHEN 325 MG PO TABS
650.0000 mg | ORAL_TABLET | ORAL | Status: DC | PRN
  Administered 2021-10-05 – 2021-10-11 (×14): 650 mg via ORAL
  Filled 2021-10-05 (×19): qty 2

## 2021-10-05 MED ORDER — SODIUM CHLORIDE 0.9 % IV SOLN
2.0000 g | INTRAVENOUS | Status: DC
  Administered 2021-10-05 – 2021-10-08 (×4): 2 g via INTRAVENOUS
  Filled 2021-10-05 (×4): qty 20

## 2021-10-05 MED ORDER — LACTATED RINGERS IV SOLN
INTRAVENOUS | Status: DC
  Administered 2021-10-09 – 2021-10-10 (×3): 125 mL/h via INTRAVENOUS

## 2021-10-05 MED ORDER — ZOLPIDEM TARTRATE 5 MG PO TABS: 5.0000 mg | ORAL_TABLET | Freq: Every evening | ORAL | Status: DC | PRN

## 2021-10-05 MED ORDER — CALCIUM CARBONATE ANTACID 500 MG PO CHEW
2.0000 | CHEWABLE_TABLET | ORAL | Status: DC | PRN
  Administered 2021-10-06 – 2021-10-10 (×6): 400 mg via ORAL
  Filled 2021-10-05 (×6): qty 2

## 2021-10-05 MED ORDER — DOCUSATE SODIUM 100 MG PO CAPS
100.0000 mg | ORAL_CAPSULE | Freq: Every day | ORAL | Status: DC
  Administered 2021-10-05 – 2021-10-06 (×2): 100 mg via ORAL
  Filled 2021-10-05 (×2): qty 1

## 2021-10-05 MED ORDER — PRENATAL MULTIVITAMIN CH
1.0000 | ORAL_TABLET | Freq: Every day | ORAL | Status: DC
  Administered 2021-10-06 – 2021-10-13 (×6): 1 via ORAL
  Filled 2021-10-05 (×6): qty 1

## 2021-10-05 MED ORDER — LACTATED RINGERS IV BOLUS: 500.0000 mL | Freq: Once | INTRAVENOUS | Status: DC

## 2021-10-05 NOTE — H&P (Signed)
FACULTY PRACTICE ANTEPARTUM ADMISSION HISTORY AND PHYSICAL NOTE   History of Present Illness: Dawn Small is a 26 y.o. [id number] at [redacted]w[redacted]d admitted for pyelonephritis. Patient was admitted last night due to fetal tachycardia after presenting for chills and lightheadedness - was discharged this morning. Her covid & flu swab were negative. Since leaving the hospital this morning she has continued to feel worse. Symptoms include fatigue, fever, headaches, vomiting, and back pain. Vomited twice earlier today. Hasn't taken anything for her symptoms. Has noticed increased urinary frequency but denies dysuria or hematuria.   Patient reports the fetal movement as active. Patient reports uterine contraction  activity as none. Patient reports  vaginal bleeding as none. Patient describes fluid per vagina as None.  Patient Active Problem List   Diagnosis Date Noted   Antepartum fetal tachycardia affecting care of mother 10/05/2021   Pyelonephritis affecting pregnancy 10/05/2021   Group B Streptococcus carrier, +RV culture, currently pregnant 09/30/2021   Iron deficiency anemia of mother during pregnancy 07/29/2021   Alpha thalassemia trait 05/30/2021   Supervision of normal pregnancy, antepartum 04/23/2021    Past Medical History:  Diagnosis Date   Asthma     Past Surgical History:  Procedure Laterality Date   WISDOM TOOTH EXTRACTION      OB History  Gravida Para Term Preterm AB Living  5       4    SAB IAB Ectopic Multiple Live Births    4          # Outcome Date GA Lbr Len/2nd Weight Sex Delivery Anes PTL Lv  5 Current           4 IAB           3 IAB           2 IAB           1 IAB             Social History   Socioeconomic History   Marital status: Single    Spouse name: Not on file   Number of children: Not on file   Years of education: Not on file   Highest education level: Not on file  Occupational History   Not on file  Tobacco Use   Smoking status: Never    Smokeless tobacco: Never  Vaping Use   Vaping Use: Never used  Substance and Sexual Activity   Alcohol use: Yes    Comment: rarely   Drug use: Never   Sexual activity: Yes  Other Topics Concern   Not on file  Social History Narrative   Not on file   Social Determinants of Health   Financial Resource Strain: Not on file  Food Insecurity: Not on file  Transportation Needs: Not on file  Physical Activity: Not on file  Stress: Not on file  Social Connections: Not on file    Family History  Problem Relation Age of Onset   Cancer Maternal Aunt     No Known Allergies  Medications Prior to Admission  Medication Sig Dispense Refill Last Dose   ferrous sulfate 325 (65 FE) MG EC tablet Take 1 tablet (325 mg total) by mouth every other day. 30 tablet 2 10/05/2021   Prenatal Vit-Fe Fumarate-FA (MULTIVITAMIN-PRENATAL) 27-0.8 MG TABS tablet Take 1 tablet by mouth daily at 12 noon.   10/05/2021   ascorbic acid (VITAMIN C) 500 MG tablet Take 1 tablet (500 mg total) by mouth every other day. Take with iron pill  30 tablet 3    Blood Pressure Monitoring (BLOOD PRESSURE KIT) DEVI 1 kit by Does not apply route once a week. Check Blood Pressure regularly and record readings into the Babyscripts App.  Large Cuff.  DX O90.0 1 each 0    Misc. Devices (GOJJI WEIGHT SCALE) MISC 1 Device by Does not apply route once a week. 1 each 0    ondansetron (ZOFRAN ODT) 4 MG disintegrating tablet Take 1 tablet (4 mg total) by mouth every 6 (six) hours as needed for nausea. (Patient not taking: Reported on 07/25/2021) 20 tablet 0    promethazine (PHENERGAN) 25 MG tablet Take 1 tablet (25 mg total) by mouth every 6 (six) hours as needed for nausea or vomiting. (Patient not taking: Reported on 07/25/2021) 30 tablet 0     Review of Systems - Negative except what's listed in HPI History obtained from the patient  Vitals:  BP (!) 110/56 (BP Location: Right Arm)    Pulse (!) 137    Temp (!) 101.1 F (38.4 C) (Oral)     Resp 18    Ht $R'5\' 3"'Fl$  (1.6 m)    Wt 80.8 kg    LMP 01/15/2021 (Approximate)    SpO2 100%    BMI 31.55 kg/m  Physical Examination: CONSTITUTIONAL: Well-developed, well-nourished female. Ill appearing HENT:  Normocephalic, atraumatic, External right and left ear normal. Oropharynx is clear and moist EYES: Conjunctivae and EOM are normal. Pupils are equal, round, and reactive to light. No scleral icterus.  NECK: Normal range of motion, supple, no masses SKIN: Skin is warm and dry. No rash noted. Not diaphoretic. No erythema. No pallor. [place]: Alert and oriented to person, place, and time. Normal reflexes, muscle tone coordination. No cranial nerve deficit noted. PSYCHIATRIC: Normal mood and affect. Normal behavior. Normal judgment and thought content. CARDIOVASCULAR: Tachycardia. Normal rhythm.  RESPIRATORY: Effort and breath sounds normal, no problems with respiration noted ABDOMEN: Soft, nontender, nondistended, gravid. +Right sided CVA tenderness   Cervix:  1/50/-3 per RN check Membranes:intact Fetal Monitoring:Baseline: 170 bpm, Variability: Good {> 6 bpm), Accelerations: 10x10, and Decelerations: Absent Tocometer:  Q3-6 minutes  Labs:  No results found for this or any previous visit (from the past 24 hour(s)).  Imaging Studies: No results found.   Assessment and Plan: 1. Pyelonephritis affecting pregnancy in third trimester  -urine culture pending -will admit for IV fluids & antibiotics  2. Iron deficiency anemia of mother during pregnancy  -IV venofer while inpatient  3. [redacted] weeks gestation of pregnancy     Jorje Guild, NP 10/05/2021 8:53 PM

## 2021-10-05 NOTE — MAU Note (Signed)
..  Dawn Small is a 26 y.o. at [redacted]w[redacted]d here in MAU reporting: N/V with red and Drier emesis twice today with constant nausea, feeling fatigue, lighthead, SOB, possible fever by pt reports feels like she overheating all day. Pt states she was discharged this am for [facility] after overnight observation due to fetal tachycardia. Pt denies DFM, VB, cough, CTX or cramping, LOF, and PIH s/s.    Pain score: Back pain 7/10, neck pain 5/10  There were no vitals filed for this visit.   FHT:163 Lab orders placed from triage:  none

## 2021-10-05 NOTE — Discharge Summary (Signed)
Physician Discharge Summary  Patient ID: Dawn Small MRN: [id number] DOB/AGE: April 12, 1996 26 y.o.  Admit date: 10/04/2021 Discharge date: 10/05/2021  Admission Diagnoses: Fetal tachycardia  Discharge Diagnoses:  Active Problems:   Antepartum fetal tachycardia affecting care of mother   Discharged Condition: stable  Hospital Course: 40GQ Q7Y1950@ [redacted]w[redacted]d admitted due to fetal tachycardia due to suspected maternal infection, now likely gastritis.  Pt initially reported chills and not feeling well, notes considerable improvement of her symptoms.  On arrival FHT noted to be 180-210.  FHT improved to 150bpm, reactive NST.  Pt had noted some irregular contractions, cervical exam completed, pt 1cm  Consults: None  Significant Diagnostic Studies: labs:  Results for orders placed or performed during the hospital encounter of 10/04/21 (from the past 24 hour(s))  Urinalysis, Routine w reflex microscopic Nasopharyngeal Swab     Status: Abnormal   Collection Time: 10/04/21  7:35 PM  Result Value Ref Range   Color, Urine YELLOW YELLOW   APPearance CLOUDY (A) CLEAR   Specific Gravity, Urine 1.012 1.005 - 1.030   pH 5.0 5.0 - 8.0   Glucose, UA NEGATIVE NEGATIVE mg/dL   Hgb urine dipstick SMALL (A) NEGATIVE   Bilirubin Urine NEGATIVE NEGATIVE   Ketones, ur NEGATIVE NEGATIVE mg/dL   Protein, ur 30 (A) NEGATIVE mg/dL   Nitrite POSITIVE (A) NEGATIVE   Leukocytes,Ua LARGE (A) NEGATIVE   RBC / HPF 0-5 0 - 5 RBC/hpf   WBC, UA >50 (H) 0 - 5 WBC/hpf   Bacteria, UA RARE (A) NONE SEEN   Squamous Epithelial / LPF 0-5 0 - 5   WBC Clumps PRESENT    Mucus PRESENT    Non Squamous Epithelial 0-5 (A) NONE SEEN  Resp Panel by RT-PCR (Flu A&B, Covid) Nasopharyngeal Swab     Status: None   Collection Time: 10/04/21  7:35 PM   Specimen: Nasopharyngeal Swab; Nasopharyngeal(NP) swabs in vial transport medium  Result Value Ref Range   SARS Coronavirus 2 by RT PCR NEGATIVE NEGATIVE   Influenza A by PCR NEGATIVE  NEGATIVE   Influenza B by PCR NEGATIVE NEGATIVE  CBC     Status: Abnormal   Collection Time: 10/04/21  7:35 PM  Result Value Ref Range   WBC 10.2 4.0 - 10.5 K/uL   RBC 4.45 3.87 - 5.11 MIL/uL   Hemoglobin 7.9 (L) 12.0 - 15.0 g/dL   HCT 28.5 (L) 36.0 - 46.0 %   MCV 64.0 (L) 80.0 - 100.0 fL   MCH 17.8 (L) 26.0 - 34.0 pg   MCHC 27.7 (L) 30.0 - 36.0 g/dL   RDW 28.5 (H) 11.5 - 15.5 %   Platelets 307 150 - 400 K/uL   nRBC 0.3 (H) 0.0 - 0.2 %  Comprehensive metabolic panel     Status: Abnormal   Collection Time: 10/04/21  7:35 PM  Result Value Ref Range   Sodium 134 (L) 135 - 145 mmol/L   Potassium 3.8 3.5 - 5.1 mmol/L   Chloride 104 98 - 111 mmol/L   CO2 21 (L) 22 - 32 mmol/L   Glucose, Bld 92 70 - 99 mg/dL   BUN 6 6 - 20 mg/dL   Creatinine, Ser 0.76 0.44 - 1.00 mg/dL   Calcium 8.6 (L) 8.9 - 10.3 mg/dL   Total Protein 6.8 6.5 - 8.1 g/dL   Albumin 2.7 (L) 3.5 - 5.0 g/dL   AST 19 15 - 41 U/L   ALT 15 0 - 44 U/L   Alkaline Phosphatase 119  38 - 126 U/L   Total Bilirubin 1.0 0.3 - 1.2 mg/dL   GFR, Estimated >60 >60 mL/min   Anion gap 9 5 - 15  Type and screen [place]     Status: None   Collection Time: 10/04/21  8:34 PM  Result Value Ref Range   ABO/RH(D) A POS    Antibody Screen NEG    Sample Expiration      10/07/2021,2359 Performed at [facility], [place] [address], [place], [place] [address]      Treatments: IV hydration  Discharge Exam: Blood pressure (!) 105/52, pulse (!) 106, temperature 98.3 F (36.8 C), temperature source Oral, resp. rate 18, height $RemoveBe'5\' 3"'affWfgwoz$  (1.6 m), weight 79.8 kg, last menstrual period 01/15/2021, SpO2 100 %. General appearance: alert, cooperative, and no distress Resp: clear to auscultation bilaterally Cardio: regular rate and rhythm GI: gravid, soft and non-tender Extremities: no edema, no calf tenderness Skin: warm and dry  FHT: 150 bpm, moderate variability, +accels, no decels Toco: irregular  contractions  Disposition: Discharge disposition: 01-Home or Self Care        Allergies as of 10/05/2021   No Known Allergies      Medication List     TAKE these medications    ascorbic acid 500 MG tablet Commonly known as: VITAMIN C Take 1 tablet (500 mg total) by mouth every other day. Take with iron pill   Blood Pressure Kit Devi 1 kit by Does not apply route once a week. Check Blood Pressure regularly and record readings into the Babyscripts App.  Large Cuff.  DX O90.0   ferrous sulfate 325 (65 FE) MG EC tablet Take 1 tablet (325 mg total) by mouth every other day.   Gojji Weight Scale Misc 1 Device by Does not apply route once a week.   multivitamin-prenatal 27-0.8 MG Tabs tablet Take 1 tablet by mouth daily at 12 noon.   ondansetron 4 MG disintegrating tablet Commonly known as: Zofran ODT Take 1 tablet (4 mg total) by mouth every 6 (six) hours as needed for nausea.   promethazine 25 MG tablet Commonly known as: PHENERGAN Take 1 tablet (25 mg total) by mouth every 6 (six) hours as needed for nausea or vomiting.        Follow-up Information     [place] Follow up.   Why: Please follow up as scheduled Contact information: [address] [place] [address] [phone]                Signed: Annalee Genta 10/05/2021, 6:50 AM

## 2021-10-05 NOTE — Progress Notes (Signed)
Upon discharge and evaluating FHR tracing, I noticed that the FHT was elevated. Called MD at 0730, and received orders for fluid bolus. New IV was inserted and baby placed on the monitor. 500 cc LR bolus given. Patient states contractions 5/10 and new onset chills. Patient is afebrile with temp 98.4 orally. Will continue to monitor.

## 2021-10-06 ENCOUNTER — Encounter (HOSPITAL_COMMUNITY): Payer: Self-pay | Admitting: Obstetrics and Gynecology

## 2021-10-06 DIAGNOSIS — O2303 Infections of kidney in pregnancy, third trimester: Secondary | ICD-10-CM | POA: Diagnosis not present

## 2021-10-06 DIAGNOSIS — Z3A37 37 weeks gestation of pregnancy: Secondary | ICD-10-CM | POA: Diagnosis not present

## 2021-10-06 DIAGNOSIS — O2343 Unspecified infection of urinary tract in pregnancy, third trimester: Secondary | ICD-10-CM | POA: Diagnosis not present

## 2021-10-06 DIAGNOSIS — N12 Tubulo-interstitial nephritis, not specified as acute or chronic: Secondary | ICD-10-CM | POA: Diagnosis not present

## 2021-10-06 MED ORDER — SODIUM CHLORIDE 0.9 % IV BOLUS
500.0000 mL | Freq: Once | INTRAVENOUS | Status: AC
  Administered 2021-10-06: 500 mL via INTRAVENOUS

## 2021-10-06 MED ORDER — SODIUM CHLORIDE 0.9 % IV SOLN
500.0000 mg | Freq: Once | INTRAVENOUS | Status: AC
  Administered 2021-10-06: 500 mg via INTRAVENOUS
  Filled 2021-10-06: qty 25

## 2021-10-06 NOTE — Progress Notes (Signed)
Patient ID: Dawn Small, female   DOB: 08/10/1996, 26 y.o.   MRN: [id number] ACULTY PRACTICE ANTEPARTUM COMPREHENSIVE PROGRESS NOTE  Dawn Small is a 26 y.o. [id number] at [redacted]w[redacted]d  who is admitted for pyelonephritis.   Fetal presentation is cephalic. Length of Stay:  1  Days  Subjective: Pt reports feeling some better this morning Patient reports good fetal movement.  She reports occ uterine contractions, no bleeding and no loss of fluid per vagina.  Vitals:  Blood pressure (!) 111/53, pulse (!) 125, temperature 98.4 F (36.9 C), temperature source Oral, resp. rate 20, height 5\' 3"  (1.6 m), weight 80.8 kg, last menstrual period 01/15/2021, SpO2 97 %. Physical Examination: Lungs clear Heart RRR Abd soft + BS gravid R CVA tenderness Ext non tender  Fetal Monitoring:  Baseline: 140 bpm, Variability: Good {> 6 bpm), Accelerations: Reactive, and Decelerations: Absent  Labs:  Results for orders placed or performed during the hospital encounter of 10/05/21 (from the past 24 hour(s))  Type and screen [facility]   Collection Time: 10/05/21  9:27 PM  Result Value Ref Range   ABO/RH(D) A POS    Antibody Screen NEG    Sample Expiration      10/08/2021,2359 Performed at [facility], [address], [place], [place]     Imaging Studies:    NA   Medications:  Scheduled  docusate sodium  100 mg Oral Daily   prenatal multivitamin  1 tablet Oral Q1200   I have reviewed the patient's current medications.  ASSESSMENT: IUP 37 5/7 weeks Pyelonephritis Anemia  PLAN: Stable. Continue with IV antibiotics. Venefor for anemia Continue routine antenatal care.   7/7 10/06/2021,7:35 AM

## 2021-10-07 ENCOUNTER — Inpatient Hospital Stay (HOSPITAL_COMMUNITY): Payer: Medicaid Other

## 2021-10-07 DIAGNOSIS — Z3A37 37 weeks gestation of pregnancy: Secondary | ICD-10-CM | POA: Diagnosis not present

## 2021-10-07 DIAGNOSIS — N1330 Unspecified hydronephrosis: Secondary | ICD-10-CM | POA: Diagnosis not present

## 2021-10-07 DIAGNOSIS — N390 Urinary tract infection, site not specified: Secondary | ICD-10-CM | POA: Diagnosis not present

## 2021-10-07 DIAGNOSIS — O2303 Infections of kidney in pregnancy, third trimester: Secondary | ICD-10-CM | POA: Diagnosis not present

## 2021-10-07 LAB — CBC WITH DIFFERENTIAL/PLATELET
Abs Immature Granulocytes: 0.25 10*3/uL — ABNORMAL HIGH (ref 0.00–0.07)
Basophils Absolute: 0 10*3/uL (ref 0.0–0.1)
Basophils Relative: 0 %
Eosinophils Absolute: 0 10*3/uL (ref 0.0–0.5)
Eosinophils Relative: 0 %
HCT: 25.3 % — ABNORMAL LOW (ref 36.0–46.0)
Hemoglobin: 7.1 g/dL — ABNORMAL LOW (ref 12.0–15.0)
Immature Granulocytes: 2 %
Lymphocytes Relative: 19 %
Lymphs Abs: 2.2 10*3/uL (ref 0.7–4.0)
MCH: 17.8 pg — ABNORMAL LOW (ref 26.0–34.0)
MCHC: 28.1 g/dL — ABNORMAL LOW (ref 30.0–36.0)
MCV: 63.6 fL — ABNORMAL LOW (ref 80.0–100.0)
Monocytes Absolute: 1.6 10*3/uL — ABNORMAL HIGH (ref 0.1–1.0)
Monocytes Relative: 14 %
Neutro Abs: 7.3 10*3/uL (ref 1.7–7.7)
Neutrophils Relative %: 65 %
Platelets: 221 10*3/uL (ref 150–400)
RBC: 3.98 MIL/uL (ref 3.87–5.11)
RDW: 28.5 % — ABNORMAL HIGH (ref 11.5–15.5)
WBC: 11.5 10*3/uL — ABNORMAL HIGH (ref 4.0–10.5)
nRBC: 0.3 % — ABNORMAL HIGH (ref 0.0–0.2)

## 2021-10-07 MED ORDER — ONDANSETRON HCL 4 MG/2ML IJ SOLN
4.0000 mg | Freq: Four times a day (QID) | INTRAMUSCULAR | Status: DC | PRN
  Administered 2021-10-07 – 2021-10-10 (×4): 4 mg via INTRAVENOUS
  Filled 2021-10-07 (×4): qty 2

## 2021-10-07 MED ORDER — OXYCODONE HCL 5 MG PO TABS
5.0000 mg | ORAL_TABLET | Freq: Four times a day (QID) | ORAL | Status: DC | PRN
  Administered 2021-10-07 – 2021-10-08 (×2): 5 mg via ORAL
  Filled 2021-10-07 (×2): qty 1

## 2021-10-07 MED ORDER — DOCUSATE SODIUM 100 MG PO CAPS
100.0000 mg | ORAL_CAPSULE | Freq: Two times a day (BID) | ORAL | Status: DC
  Administered 2021-10-07 – 2021-10-14 (×7): 100 mg via ORAL
  Filled 2021-10-07 (×13): qty 1

## 2021-10-07 NOTE — Progress Notes (Signed)
FACULTY PRACTICE ANTEPARTUM(COMPREHENSIVE) NOTE  Dawn Small is a 26 y.o. [id number] with Estimated Date of Delivery: 10/22/21   By  LMP [redacted]w[redacted]d  who is admitted for pyelonephritis.    Fetal presentation is cephalic. Length of Stay:  2  Days  Date of admission:10/05/2021  Subjective: Today she reports her pain is about the same- mostly right sided back pain.  Tolerating gen diet.  No nausea/vomiting.  No acute events overnight. Patient reports the fetal movement as active. Patient reports uterine contraction  activity as occasional. Patient reports  vaginal bleeding as none. Patient describes fluid per vagina as None.  Vitals:  Blood pressure (!) 109/57, pulse (!) 106, temperature 98.6 F (37 C), temperature source Oral, resp. rate 18, height 5\' 3"  (1.6 m), weight 80.8 kg, last menstrual period 01/15/2021, SpO2 100 %. Vitals:   10/06/21 1954 10/06/21 2118 10/06/21 2301 10/07/21 0521  BP: (!) 107/45  (!) 96/43 (!) 109/57  Pulse: (!) 110  (!) 101 (!) 106  Resp: 17  18 18   Temp: 100.2 F (37.9 C) 99.9 F (37.7 C) 98.8 F (37.1 C) 98.6 F (37 C)  TempSrc: Oral Oral Oral Oral  SpO2: 100%  99% 100%  Weight:      Height:       Physical Examination:  General appearance - ill-appearing Mental status - normal mood, behavior, speech, dress, motor activity, and thought processes Chest - CTAB Heart - normal rate and regular rhythm Abdomen - gravid, soft and non-tender Back exam - + right CVA tenderness Extremities - no pedal edema noted Skin - warm and dryl   Fetal Monitoring:  Baseline: 155 bpm, Variability: moderate, Accelerations: +15x15 accels, and Decelerations: Absent    reactive  Labs:  Results for orders placed or performed during the hospital encounter of 10/05/21 (from the past 24 hour(s))  CBC with Differential/Platelet   Collection Time: 10/07/21  4:41 AM  Result Value Ref Range   WBC 11.5 (H) 4.0 - 10.5 K/uL   RBC 3.98 3.87 - 5.11 MIL/uL   Hemoglobin 7.1 (L) 12.0 - 15.0  g/dL   HCT 12/03/21 (L) 12/05/21 - 53.6 %   MCV 63.6 (L) 80.0 - 100.0 fL   MCH 17.8 (L) 26.0 - 34.0 pg   MCHC 28.1 (L) 30.0 - 36.0 g/dL   RDW 14.4 (H) 31.5 - 40.0 %   Platelets 221 150 - 400 K/uL   nRBC 0.3 (H) 0.0 - 0.2 %   Neutrophils Relative % 65 %   Neutro Abs 7.3 1.7 - 7.7 K/uL   Lymphocytes Relative 19 %   Lymphs Abs 2.2 0.7 - 4.0 K/uL   Monocytes Relative 14 %   Monocytes Absolute 1.6 (H) 0.1 - 1.0 K/uL   Eosinophils Relative 0 %   Eosinophils Absolute 0.0 0.0 - 0.5 K/uL   Basophils Relative 0 %   Basophils Absolute 0.0 0.0 - 0.1 K/uL   WBC Morphology MORPHOLOGY UNREMARKABLE    Smear Review MORPHOLOGY UNREMARKABLE    Immature Granulocytes 2 %   Abs Immature Granulocytes 0.25 (H) 0.00 - 0.07 K/uL   Schistocytes PRESENT    Tear Drop Cells PRESENT     ASSESSMENT: 86.7 [redacted]w[redacted]d Estimated Date of Delivery: 10/22/21  Patient Active Problem List   Diagnosis Date Noted   Antepartum fetal tachycardia affecting care of mother 10/05/2021   Pyelonephritis affecting pregnancy 10/05/2021   Group B Streptococcus carrier, +RV culture, currently pregnant 09/30/2021   Iron deficiency anemia of mother during pregnancy 07/29/2021  Alpha thalassemia trait 05/30/2021   Supervision of normal pregnancy, antepartum 04/23/2021    PLAN: 1) Pyelonephritis -currently on IV Rocephin -Last temp- 100.9 on 1/2@ 0347 -slight leukocytosis noted this am -urine culture pending -Renal US ordered  2) FWB- Cat. I Reassuring, continue NST q shift No evidence of preterm labor  3) Iron def. Anemia -IV Venofer given  DISP: While pt now afebrile x 24hr, clinically minimal improvement.  Renal US ordered.  Continue IV ABX until at least 48hr afebrile  Sharon Seller 10/07/2021,7:29 AM

## 2021-10-08 DIAGNOSIS — O2303 Infections of kidney in pregnancy, third trimester: Secondary | ICD-10-CM

## 2021-10-08 DIAGNOSIS — Z3A37 37 weeks gestation of pregnancy: Secondary | ICD-10-CM | POA: Diagnosis not present

## 2021-10-08 DIAGNOSIS — O99019 Anemia complicating pregnancy, unspecified trimester: Secondary | ICD-10-CM | POA: Diagnosis not present

## 2021-10-08 LAB — VITAMIN B12: Vitamin B-12: 395 pg/mL (ref 180–914)

## 2021-10-08 LAB — RETICULOCYTES
Immature Retic Fract: 35.3 % — ABNORMAL HIGH (ref 2.3–15.9)
RBC.: 3.84 MIL/uL — ABNORMAL LOW (ref 3.87–5.11)
Retic Count, Absolute: 65.7 10*3/uL (ref 19.0–186.0)
Retic Ct Pct: 1.7 % (ref 0.4–3.1)

## 2021-10-08 LAB — CULTURE, OB URINE: Culture: >=100000 — AB

## 2021-10-08 LAB — CBC WITH DIFFERENTIAL/PLATELET
Abs Immature Granulocytes: 0.12 10*3/uL — ABNORMAL HIGH (ref 0.00–0.07)
Basophils Absolute: 0 10*3/uL (ref 0.0–0.1)
Basophils Relative: 1 %
Eosinophils Absolute: 0 10*3/uL (ref 0.0–0.5)
Eosinophils Relative: 0 %
HCT: 25 % — ABNORMAL LOW (ref 36.0–46.0)
Hemoglobin: 6.8 g/dL — CL (ref 12.0–15.0)
Immature Granulocytes: 1 %
Lymphocytes Relative: 25 %
Lymphs Abs: 2.1 10*3/uL (ref 0.7–4.0)
MCH: 17.4 pg — ABNORMAL LOW (ref 26.0–34.0)
MCHC: 27.2 g/dL — ABNORMAL LOW (ref 30.0–36.0)
MCV: 63.9 fL — ABNORMAL LOW (ref 80.0–100.0)
Monocytes Absolute: 1.2 10*3/uL — ABNORMAL HIGH (ref 0.1–1.0)
Monocytes Relative: 15 %
Neutro Abs: 4.9 10*3/uL (ref 1.7–7.7)
Neutrophils Relative %: 58 %
Platelets: 211 10*3/uL (ref 150–400)
RBC: 3.91 MIL/uL (ref 3.87–5.11)
RDW: 28.5 % — ABNORMAL HIGH (ref 11.5–15.5)
WBC: 8.4 10*3/uL (ref 4.0–10.5)
nRBC: 0.5 % — ABNORMAL HIGH (ref 0.0–0.2)

## 2021-10-08 LAB — IRON AND TIBC
Iron: 54 ug/dL (ref 28–170)
Saturation Ratios: 15 % (ref 10.4–31.8)
TIBC: 350 ug/dL (ref 250–450)
UIBC: 296 ug/dL

## 2021-10-08 LAB — FERRITIN: Ferritin: 509 ng/mL — ABNORMAL HIGH (ref 11–307)

## 2021-10-08 NOTE — Progress Notes (Signed)
Date and time results received: 10/08/21 1032   Test: Hgb Critical Value: 6.8  Name of Provider Notified: Dr. Para March  Orders Received? Or Actions Taken?: MD placing orders.

## 2021-10-08 NOTE — Progress Notes (Signed)
FACULTY PRACTICE ANTEPARTUM(COMPREHENSIVE) NOTE  Dawn Small is a 26 y.o. [id number] with Estimated Date of Delivery: 10/22/21   By  LMP [redacted]w[redacted]d  who is admitted for pyelonephritis.    Fetal presentation is cephalic. Length of Stay:  3  Days  Date of admission:10/05/2021  Subjective: Today she reports her pain is about the same. She feels gradual improvement but wishes she felt better sooner. Tolerating gen diet.  No nausea/vomiting.  No acute events overnight. Patient reports the fetal movement as active. Patient reports uterine contraction activity as occasional. Patient reports  vaginal bleeding as none. Patient describes fluid per vagina as None.  Vitals:  Blood pressure (!) 110/50, pulse (!) 116, temperature 100.3 F (37.9 C), temperature source Oral, resp. rate 18, height 5\' 3"  (1.6 m), weight 80.8 kg, last menstrual period 01/15/2021, SpO2 99 %. Vitals:   10/07/21 1550 10/07/21 1945 10/07/21 2257 10/08/21 0743  BP: (!) 95/48 (!) 98/46 (!) 92/49 (!) 110/50  Pulse: (!) 107 (!) 111 (!) 119 (!) 116  Resp: 16 18 18 18   Temp: 98.1 F (36.7 C) 98.1 F (36.7 C) 98.5 F (36.9 C) 100.3 F (37.9 C)  TempSrc: Oral Oral Oral Oral  SpO2: 97% 99% 99% 99%  Weight:      Height:       Physical Examination: General appearance - well appearing Mental status - normal mood, behavior, speech, dress, motor activity, and thought processes Chest - CTAB Heart - normal rate and regular rhythm Abdomen - gravid, soft and non-tender Back exam - + right CVA tenderness Extremities - no pedal edema noted Skin - warm and dry   Fetal Monitoring:  Baseline: 125 bpm, Variability: moderate, Accelerations: +15x15 accels, and Decelerations: Absent    reactive  Labs:  Results for orders placed or performed during the hospital encounter of 10/05/21 (from the past 24 hour(s))  CBC with Differential/Platelet   Collection Time: 10/08/21  9:40 AM  Result Value Ref Range   WBC 8.4 4.0 - 10.5 K/uL   RBC 3.91  3.87 - 5.11 MIL/uL   Hemoglobin 6.8 (LL) 12.0 - 15.0 g/dL   HCT 12/03/21 (L) 12/06/21 - 65.4 %   MCV 63.9 (L) 80.0 - 100.0 fL   MCH 17.4 (L) 26.0 - 34.0 pg   MCHC 27.2 (L) 30.0 - 36.0 g/dL   RDW 65.0 (H) 35.4 - 65.6 %   Platelets 211 150 - 400 K/uL   nRBC 0.5 (H) 0.0 - 0.2 %   Neutrophils Relative % PENDING %   Neutro Abs PENDING 1.7 - 7.7 K/uL   Band Neutrophils PENDING %   Lymphocytes Relative PENDING %   Lymphs Abs PENDING 0.7 - 4.0 K/uL   Monocytes Relative PENDING %   Monocytes Absolute PENDING 0.1 - 1.0 K/uL   Eosinophils Relative PENDING %   Eosinophils Absolute PENDING 0.0 - 0.5 K/uL   Basophils Relative PENDING %   Basophils Absolute PENDING 0.0 - 0.1 K/uL   WBC Morphology PENDING    RBC Morphology PENDING    Smear Review PENDING    Other PENDING %   nRBC PENDING 0 /100 WBC   Metamyelocytes Relative PENDING %   Myelocytes PENDING %   Promyelocytes Relative PENDING %   Blasts PENDING %   Immature Granulocytes PENDING %   Abs Immature Granulocytes PENDING 0.00 - 0.07 K/uL    ASSESSMENT: [id number] [redacted]w[redacted]d Estimated Date of Delivery: 10/22/21  Patient Active Problem List   Diagnosis Date Noted   Antepartum fetal tachycardia  affecting care of mother 10/05/2021   Pyelonephritis affecting pregnancy 10/05/2021   Group B Streptococcus carrier, +RV culture, currently pregnant 09/30/2021   Iron deficiency anemia of mother during pregnancy 07/29/2021   Alpha thalassemia trait 05/30/2021   Supervision of normal pregnancy, antepartum 04/23/2021    PLAN: 1) Pyelonephritis -currently on IV Rocephin -Last temp- 100.3 on 1/4 0800 -- while technically not a fever it is at the borderline that given her being 38w, I would advise one more day for antibiotics before d/c home with PO antibiotics -leukocytosis improved -urine culture c/w pan sensitive e. Coli.  -Renal US normal besides physiologic hydro with pregnancy  2) FWB- Cat. I Reassuring, continue NST qday No evidence of preterm  labor  3) Iron def. Anemia - IV Venofer given - HgB overall stable around 7.2. She has microcytic anemia. Anemia panel ordered although may be skewed by recent IV iron infusion.   DISP: Awaiting 24-48 hours of pt being afebrile   Milas Hock 10/08/2021,10:48 AM

## 2021-10-09 ENCOUNTER — Encounter (HOSPITAL_COMMUNITY): Payer: Self-pay | Admitting: Obstetrics and Gynecology

## 2021-10-09 DIAGNOSIS — Z3A37 37 weeks gestation of pregnancy: Secondary | ICD-10-CM | POA: Diagnosis not present

## 2021-10-09 DIAGNOSIS — O2303 Infections of kidney in pregnancy, third trimester: Secondary | ICD-10-CM | POA: Diagnosis not present

## 2021-10-09 LAB — CBC WITH DIFFERENTIAL/PLATELET
Abs Immature Granulocytes: 0 10*3/uL (ref 0.00–0.07)
Band Neutrophils: 15 %
Basophils Absolute: 0 10*3/uL (ref 0.0–0.1)
Basophils Relative: 0 %
Eosinophils Absolute: 0 10*3/uL (ref 0.0–0.5)
Eosinophils Relative: 0 %
HCT: 25.4 % — ABNORMAL LOW (ref 36.0–46.0)
Hemoglobin: 7.1 g/dL — ABNORMAL LOW (ref 12.0–15.0)
Lymphocytes Relative: 12 %
Lymphs Abs: 1.1 10*3/uL (ref 0.7–4.0)
MCH: 18 pg — ABNORMAL LOW (ref 26.0–34.0)
MCHC: 28 g/dL — ABNORMAL LOW (ref 30.0–36.0)
MCV: 64.3 fL — ABNORMAL LOW (ref 80.0–100.0)
Monocytes Absolute: 0.6 10*3/uL (ref 0.1–1.0)
Monocytes Relative: 7 %
Neutro Abs: 7.4 10*3/uL (ref 1.7–7.7)
Neutrophils Relative %: 66 %
Platelets: 198 10*3/uL (ref 150–400)
RBC: 3.95 MIL/uL (ref 3.87–5.11)
RDW: 28.9 % — ABNORMAL HIGH (ref 11.5–15.5)
WBC: 9.1 10*3/uL (ref 4.0–10.5)
nRBC: 1 % — ABNORMAL HIGH (ref 0.0–0.2)
nRBC: 1 /100 WBC — ABNORMAL HIGH

## 2021-10-09 LAB — FOLATE: Folate: 21.5 ng/mL (ref >5.9)

## 2021-10-09 MED ORDER — LACTATED RINGERS IV BOLUS
500.0000 mL | Freq: Once | INTRAVENOUS | Status: AC
  Administered 2021-10-09: 500 mL via INTRAVENOUS

## 2021-10-09 MED ORDER — SODIUM CHLORIDE 0.9 % IV SOLN
2.0000 g | Freq: Three times a day (TID) | INTRAVENOUS | Status: DC
  Administered 2021-10-09 – 2021-10-10 (×4): 2 g via INTRAVENOUS
  Filled 2021-10-09 (×5): qty 2

## 2021-10-09 MED ORDER — ACETAMINOPHEN 650 MG RE SUPP
650.0000 mg | Freq: Once | RECTAL | Status: AC
  Administered 2021-10-09: 650 mg via RECTAL
  Filled 2021-10-09: qty 1

## 2021-10-09 NOTE — Progress Notes (Signed)
Reviewed plan of care with patient. Persistent fever on Rocephin despite pan-sensi E. Coli in UCX. Will switch to cefepime but if still febrile tomorrow we discussed potential single dose of gentamicin per discussion with pharmacist. Reviewed low risk of any ototoxicity at this GA and at this dose it would be low penetration. Pt voices understanding and agreeable to the plan.

## 2021-10-09 NOTE — Progress Notes (Signed)
FACULTY PRACTICE ANTEPARTUM PROGRESS NOTE  Dawn Small is a 26 y.o. [id number] at [redacted]w[redacted]d who is admitted for pyelonephritis.  Estimated Date of Delivery: 10/22/21 Fetal presentation is cephalic.  Length of Stay:  4 Days. Admitted 10/05/2021  Subjective: Pt had fever around 0630.  She stated she had chills at this time as well.  One episode of emesis at 0600.  Mild dysuria per patient. Patient reports normal fetal movement.  She denies uterine contractions, denies bleeding and leaking of fluid per vagina.  Vitals:  Blood pressure 120/72, pulse 93, temperature (!) 101.1 F (38.4 C), temperature source Oral, resp. rate 16, height 5\' 3"  (1.6 m), weight 80.8 kg, last menstrual period 01/15/2021, SpO2 98 %. Physical Examination: CONSTITUTIONAL: Well-developed, well-nourished female in no acute distress.  HENT:  Normocephalic, atraumatic, External right and left ear normal. Oropharynx is clear and moist EYES: Conjunctivae and EOM are normal. SKIN: Skin is warm and dry. No rash noted. Not diaphoretic. No erythema. No pallor. NEUROLGIC: Alert and oriented to person, place, and time. Normal reflexes, muscle tone coordination. No cranial nerve deficit noted. PSYCHIATRIC: Normal mood and affect. Normal behavior. Normal judgment and thought content. CARDIOVASCULAR: Normal heart rate noted, regular rhythm RESPIRATORY: Effort and breath sounds normal, no problems with respiration noted MUSCULOSKELETAL: Normal range of motion. No edema and no tenderness.  Positve right CVA tenderness ABDOMEN: Soft, nontender, nondistended, gravid. CERVIX: deferred  Fetal monitoring: FHR: 165-170 bpm, Variability: moderate, Accelerations: Present, Decelerations: Absent    Results for orders placed or performed during the hospital encounter of 10/05/21 (from the past 48 hour(s))  CBC with Differential/Platelet     Status: Abnormal   Collection Time: 10/08/21  9:40 AM  Result Value Ref Range   WBC 8.4 4.0 - 10.5 K/uL    RBC 3.91 3.87 - 5.11 MIL/uL   Hemoglobin 6.8 (LL) 12.0 - 15.0 g/dL    Comment: REPEATED TO VERIFY Reticulocyte Hemoglobin testing may be clinically indicated, consider ordering this additional test 12/06/21 THIS CRITICAL RESULT HAS VERIFIED AND BEEN CALLED TO JEN CROK, RN BY DAVID APPIAH ON 01 04 2023 AT 1032, AND HAS BEEN READ BACK.     HCT 25.0 (L) 36.0 - 46.0 %   MCV 63.9 (L) 80.0 - 100.0 fL   MCH 17.4 (L) 26.0 - 34.0 pg   MCHC 27.2 (L) 30.0 - 36.0 g/dL   RDW 2024 (H) 10.9 - 32.3 %   Platelets 211 150 - 400 K/uL   nRBC 0.5 (H) 0.0 - 0.2 %   Neutrophils Relative % 58 %   Neutro Abs 4.9 1.7 - 7.7 K/uL   Lymphocytes Relative 25 %   Lymphs Abs 2.1 0.7 - 4.0 K/uL   Monocytes Relative 15 %   Monocytes Absolute 1.2 (H) 0.1 - 1.0 K/uL   Eosinophils Relative 0 %   Eosinophils Absolute 0.0 0.0 - 0.5 K/uL   Basophils Relative 1 %   Basophils Absolute 0.0 0.0 - 0.1 K/uL   Immature Granulocytes 1 %   Abs Immature Granulocytes 0.12 (H) 0.00 - 0.07 K/uL    Comment: Performed at [facility], [address], [place], [place]  Vitamin B12     Status: None   Collection Time: 10/08/21  9:40 AM  Result Value Ref Range   Vitamin B-12 395 180 - 914 pg/mL    Comment: (NOTE) This assay is not validated for testing neonatal or myeloproliferative syndrome specimens for Vitamin B12 levels. Performed at [facility],  [address], [place], [place] [address]   Iron and TIBC     Status: None   Collection Time: 10/08/21  9:40 AM  Result Value Ref Range   Iron 54 28 - 170 ug/dL   TIBC 338 250 - 539 ug/dL   Saturation Ratios 15 10.4 - 31.8 %   UIBC 296 ug/dL    Comment: Performed at [facility], [address], [place], [place] [address]  Ferritin     Status: Abnormal   Collection Time: 10/08/21  9:40 AM  Result Value Ref Range   Ferritin 509 (H) 11 - 307 ng/mL    Comment: Performed at [facility], [address], [place], [place] [address]  Reticulocytes      Status: Abnormal   Collection Time: 10/08/21  9:40 AM  Result Value Ref Range   Retic Ct Pct 1.7 0.4 - 3.1 %   RBC. 3.84 (L) 3.87 - 5.11 MIL/uL   Retic Count, Absolute 65.7 19.0 - 186.0 K/uL   Immature Retic Fract 35.3 (H) 2.3 - 15.9 %    Comment: Performed at [facility] Lab, [address], [place], [place] [address]  CBC with Differential/Platelet     Status: Abnormal   Collection Time: 10/09/21  4:59 AM  Result Value Ref Range   WBC 9.1 4.0 - 10.5 K/uL   RBC 3.95 3.87 - 5.11 MIL/uL   Hemoglobin 7.1 (L) 12.0 - 15.0 g/dL    Comment: REPEATED TO VERIFY Reticulocyte Hemoglobin testing may be clinically indicated, consider ordering this additional test [id number]    HCT 25.4 (L) 36.0 - 46.0 %   MCV 64.3 (L) 80.0 - 100.0 fL   MCH 18.0 (L) 26.0 - 34.0 pg   MCHC 28.0 (L) 30.0 - 36.0 g/dL   RDW 42.6 (H) 83.4 - 19.6 %   Platelets 198 150 - 400 K/uL   nRBC 1.0 (H) 0.0 - 0.2 %   Neutrophils Relative % 66 %   Neutro Abs 7.4 1.7 - 7.7 K/uL   Band Neutrophils 15 %   Lymphocytes Relative 12 %   Lymphs Abs 1.1 0.7 - 4.0 K/uL   Monocytes Relative 7 %   Monocytes Absolute 0.6 0.1 - 1.0 K/uL   Eosinophils Relative 0 %   Eosinophils Absolute 0.0 0.0 - 0.5 K/uL   Basophils Relative 0 %   Basophils Absolute 0.0 0.0 - 0.1 K/uL   nRBC 1 (H) 0 /100 WBC   Abs Immature Granulocytes 0.00 0.00 - 0.07 K/uL   Schistocytes PRESENT    Tear Drop Cells PRESENT    Polychromasia PRESENT     Comment: Performed at [facility], [address] [place], [place] [address]  Folate     Status: None   Collection Time: 10/09/21  4:59 AM  Result Value Ref Range   Folate 21.5 >5.9 ng/mL    Comment: Performed at [facility], [address] [place], [place] [address]    I have reviewed the patient's current medications.  ASSESSMENT: Principal Problem:   Pyelonephritis affecting pregnancy   PLAN: Monitor fever curve Continue abx. Symptom control of fevers as needed. 24 -48 hours  afebrile before d/c   Continue routine antenatal care.   Mariel Aloe, MD [facility] Faculty Attending, Center for [facility] 10/09/2021 7:27 AM

## 2021-10-10 ENCOUNTER — Inpatient Hospital Stay (HOSPITAL_COMMUNITY): Payer: Medicaid Other

## 2021-10-10 DIAGNOSIS — R509 Fever, unspecified: Secondary | ICD-10-CM | POA: Diagnosis not present

## 2021-10-10 DIAGNOSIS — O99013 Anemia complicating pregnancy, third trimester: Secondary | ICD-10-CM | POA: Diagnosis not present

## 2021-10-10 DIAGNOSIS — O2303 Infections of kidney in pregnancy, third trimester: Secondary | ICD-10-CM | POA: Diagnosis not present

## 2021-10-10 DIAGNOSIS — Z3A37 37 weeks gestation of pregnancy: Secondary | ICD-10-CM | POA: Diagnosis not present

## 2021-10-10 DIAGNOSIS — M549 Dorsalgia, unspecified: Secondary | ICD-10-CM | POA: Diagnosis not present

## 2021-10-10 DIAGNOSIS — N12 Tubulo-interstitial nephritis, not specified as acute or chronic: Secondary | ICD-10-CM | POA: Diagnosis not present

## 2021-10-10 DIAGNOSIS — O2343 Unspecified infection of urinary tract in pregnancy, third trimester: Secondary | ICD-10-CM | POA: Diagnosis present

## 2021-10-10 DIAGNOSIS — O411230 Chorioamnionitis, third trimester, not applicable or unspecified: Secondary | ICD-10-CM | POA: Diagnosis not present

## 2021-10-10 DIAGNOSIS — D509 Iron deficiency anemia, unspecified: Secondary | ICD-10-CM | POA: Diagnosis not present

## 2021-10-10 DIAGNOSIS — J90 Pleural effusion, not elsewhere classified: Secondary | ICD-10-CM | POA: Diagnosis not present

## 2021-10-10 DIAGNOSIS — O99891 Other specified diseases and conditions complicating pregnancy: Secondary | ICD-10-CM | POA: Diagnosis present

## 2021-10-10 MED ORDER — ONDANSETRON HCL 4 MG/2ML IJ SOLN
4.0000 mg | Freq: Four times a day (QID) | INTRAMUSCULAR | Status: DC
  Administered 2021-10-10 – 2021-10-14 (×16): 4 mg via INTRAVENOUS
  Filled 2021-10-10 (×16): qty 2

## 2021-10-10 MED ORDER — CEFAZOLIN SODIUM-DEXTROSE 1-4 GM/50ML-% IV SOLN
1.0000 g | Freq: Three times a day (TID) | INTRAVENOUS | Status: DC
  Administered 2021-10-10 – 2021-10-12 (×6): 1 g via INTRAVENOUS
  Filled 2021-10-10 (×11): qty 50

## 2021-10-10 MED ORDER — METOCLOPRAMIDE HCL 5 MG/ML IJ SOLN
5.0000 mg | Freq: Four times a day (QID) | INTRAMUSCULAR | Status: DC
  Administered 2021-10-10 – 2021-10-15 (×21): 5 mg via INTRAVENOUS
  Filled 2021-10-10 (×20): qty 2

## 2021-10-10 NOTE — Consult Note (Signed)
[facility]    Date of Admission:  10/05/2021     Total days of antibiotics 6  Cefepime 1/05  Ceftriaxone 1/01 >> 1/04        Reason for Consult: pyelonephritis, persistent fevers, pregnant female     Referring Provider: Para Marchuncan Primary Care Provider: Pcp, No   Assessment: Dawn Small is a 26 y.o. female currently 38w gestation admitted with fevers, chills, nausea in the setting of acute right sided pyelonephritis. While back pain has improved she has not had much improvement regarding fevers and N/V symptoms despite antimicrobial therapy. I worry she has a complicating feature to her pyelo that is delaying improvement in her symptoms - her antibiotics should be effective with recovered bacteria we have from urine. Do not feel strongly we need to escalate antibiotic coverage and would use cefazolin for this infection while we reassess imaging.  Repeat urine culture - hopeful it is sterile Repeat kidney ultrasound to ensure no evolution of abscess / obstructive pathology given atypical/slow response to treatment. Unable to do CT with current pregnancy. Maybe MRI if further imaging needed.   Dr. Daiva EvesVan Dam will follow up with Dawn Small over the weekend.   Plan: Repeat U/S of kidneys Repeat urine culture Cefazolin recommended for treating infection     Principal Problem:   Pyelonephritis affecting pregnancy    docusate sodium  100 mg Oral BID   metoCLOPramide (REGLAN) injection  5 mg Intravenous Q6H   ondansetron (ZOFRAN) IV  4 mg Intravenous Q6H   prenatal multivitamin  1 tablet Oral Q1200    HPI: Dawn Small is a 26 y.o. female admitted to the hospital for urinary symptoms, fever and right sided back pain.   Dawn Small is currently 5938w 2d gestation with a boy. She has had an uneventful pregnancy thusfar and no significant past medical history aside from IV iron infusions for pregnancy associated IDA.   She was recently admitted for  observation on 12/31 for maternal fever and fetal tachycardia. During this admission Dawn Small reported that she was suffering from chills and nausea/vomiting. She was discharged with antiemetics on presumption she was suffering from viral gastroenteritis. Came back to [facility] later that evening with onging N/V, fatigue, SOB, back pain. Admitted for acute pyelonephritis on 1/1 pm.   Urine cultures have grown out pansensitive E-coli. She has continued to fever and vomit despite ceftriaxone x 4d. Changed to cefepime yesterday PM 1/5 and has continued to fever. Rt CVA tenderness is significantly improved but not completely resolved today. Temp this AM 100.9 F  She does not think her vomiting has been any worsened with the antibiotic changes. She thinks the dysuria is gone. Main concern today is being tired, ongoing emesis and swelling of her legs. She states she is urinating every few hours and feels it has been normal quantity.   Also of note her admission HgB was 6.8 and was given IV Fe infusion.     Review of Systems: Review of Systems  Constitutional:  Positive for chills and fever.  Respiratory:  Negative for cough and sputum production.   Cardiovascular:  Positive for leg swelling. Negative for chest pain.  Gastrointestinal:  Positive for nausea and vomiting. Negative for abdominal pain.  Genitourinary:  Positive for flank pain (Rt flank pain with significant improvement). Negative for dysuria.  Neurological:  Negative for dizziness, weakness and headaches.   Past Medical History:  Diagnosis Date   Asthma  Social History   Tobacco Use   Smoking status: Never   Smokeless tobacco: Never  Vaping Use   Vaping Use: Never used  Substance Use Topics   Alcohol use: Yes    Comment: rarely   Drug use: Never    Family History  Problem Relation Age of Onset   Cancer Maternal Aunt    No Known Allergies  OBJECTIVE: Blood pressure 115/65, pulse 83, temperature 99.2 F (37.3 C), resp.  rate 16, height 5\' 3"  (1.6 m), weight 80.8 kg, last menstrual period 01/15/2021, SpO2 98 %.  Physical Exam Vitals reviewed.  Constitutional:      Appearance: Normal appearance. She is well-developed.     Comments: Sitting on side of bed in no distress.   HENT:     Mouth/Throat:     Mouth: Mucous membranes are moist.     Pharynx: Oropharynx is clear.  Eyes:     General: No scleral icterus.    Conjunctiva/sclera: Conjunctivae normal.  Cardiovascular:     Rate and Rhythm: Normal rate.     Heart sounds: No murmur heard. Pulmonary:     Effort: Pulmonary effort is normal.     Breath sounds: Normal breath sounds.  Abdominal:     Comments: Gravid, non-tender  Musculoskeletal:        General: Swelling (generalized swelling of LEs) present.     Comments: (-) CVA tenderness   Skin:    General: Skin is warm and dry.     Capillary Refill: Capillary refill takes less than 2 seconds.  Neurological:     Mental Status: She is alert and oriented to person, place, and time.    Lab Results Lab Results  Component Value Date   WBC 9.1 10/09/2021   HGB 7.1 (L) 10/09/2021   HCT 25.4 (L) 10/09/2021   MCV 64.3 (L) 10/09/2021   PLT 198 10/09/2021    Lab Results  Component Value Date   CREATININE 0.76 10/04/2021   BUN 6 10/04/2021   NA 134 (L) 10/04/2021   K 3.8 10/04/2021   CL 104 10/04/2021   CO2 21 (L) 10/04/2021    Lab Results  Component Value Date   ALT 15 10/04/2021   AST 19 10/04/2021   ALKPHOS 119 10/04/2021   BILITOT 1.0 10/04/2021     Microbiology: Recent Results (from the past 240 hour(s))  Resp Panel by RT-PCR (Flu A&B, Covid) Nasopharyngeal Swab     Status: None   Collection Time: 10/04/21  7:35 PM   Specimen: Nasopharyngeal Swab; Nasopharyngeal(NP) swabs in vial transport medium  Result Value Ref Range Status   SARS Coronavirus 2 by RT PCR NEGATIVE NEGATIVE Final    Comment: (NOTE) SARS-CoV-2 target nucleic acids are NOT DETECTED.  The SARS-CoV-2 RNA is generally  detectable in upper respiratory specimens during the acute phase of infection. The lowest concentration of SARS-CoV-2 viral copies this assay can detect is 138 copies/mL. A negative result does not preclude SARS-Cov-2 infection and should not be used as the sole basis for treatment or other patient management decisions. A negative result may occur with  improper specimen collection/handling, submission of specimen other than nasopharyngeal swab, presence of viral mutation(s) within the areas targeted by this assay, and inadequate number of viral copies(<138 copies/mL). A negative result must be combined with clinical observations, patient history, and epidemiological information. The expected result is Negative.  Fact Sheet for Patients:  10/06/21  Fact Sheet for Healthcare Providers:  BloggerCourse.com  This test is no t  yet approved or cleared by the [place] and  has been authorized for detection and/or diagnosis of SARS-CoV-2 by FDA under an Emergency Use Authorization (EUA). This EUA will remain  in effect (meaning this test can be used) for the duration of the COVID-19 declaration under Section 564(b)(1) of the Act, 21 U.S.C.section 360bbb-3(b)(1), unless the authorization is terminated  or revoked sooner.       Influenza A by PCR NEGATIVE NEGATIVE Final   Influenza B by PCR NEGATIVE NEGATIVE Final    Comment: (NOTE) The Xpert Xpress SARS-CoV-2/FLU/RSV plus assay is intended as an aid in the diagnosis of influenza from Nasopharyngeal swab specimens and should not be used as a sole basis for treatment. Nasal washings and aspirates are unacceptable for Xpert Xpress SARS-CoV-2/FLU/RSV testing.  Fact Sheet for Patients: BloggerCourse.com  Fact Sheet for Healthcare Providers: SeriousBroker.it  This test is not yet approved or cleared by the [place] FDA  and has been authorized for detection and/or diagnosis of SARS-CoV-2 by FDA under an Emergency Use Authorization (EUA). This EUA will remain in effect (meaning this test can be used) for the duration of the COVID-19 declaration under Section 564(b)(1) of the Act, 21 U.S.C. section 360bbb-3(b)(1), unless the authorization is terminated or revoked.  Performed at [facility], [address] [place], [place] [address]   OB Urine Culture     Status: Abnormal   Collection Time: 10/05/21  8:41 PM   Specimen: OB Clean Catch; Urine  Result Value Ref Range Status   Specimen Description OB CLEAN CATCH  Final   Special Requests NONE  Final   Culture (A)  Final    >=100,000 COLONIES/mL ESCHERICHIA COLI NO GROUP B STREP (S.AGALACTIAE) ISOLATED Performed at [facility], [address], [place], [place] [address]    Report Status 10/08/2021 FINAL  Final   Organism ID, Bacteria ESCHERICHIA COLI (A)  Final      Susceptibility   Escherichia coli - MIC*    AMPICILLIN <=2 SENSITIVE Sensitive     CEFAZOLIN <=4 SENSITIVE Sensitive     CEFEPIME <=0.12 SENSITIVE Sensitive     CEFTAZIDIME <=1 SENSITIVE Sensitive     CEFTRIAXONE <=0.25 SENSITIVE Sensitive     CIPROFLOXACIN <=0.25 SENSITIVE Sensitive     GENTAMICIN <=1 SENSITIVE Sensitive     IMIPENEM <=0.25 SENSITIVE Sensitive     TRIMETH/SULFA <=20 SENSITIVE Sensitive     AMPICILLIN/SULBACTAM <=2 SENSITIVE Sensitive     PIP/TAZO <=4 SENSITIVE Sensitive     * >=100,000 COLONIES/mL ESCHERICHIA COLI    Rexene Alberts, MSN, NP-C [facility] Pager: [phone]  10/10/2021 1:53 PM

## 2021-10-10 NOTE — Progress Notes (Signed)
FACULTY PRACTICE ANTEPARTUM PROGRESS NOTE  Dawn Small is a 26 y.o. [id number] at [redacted]w[redacted]d who is admitted for pyelonephritis.  Estimated Date of Delivery: 10/22/21 Fetal presentation is cephalic.  Length of Stay:  5 Days. Admitted 10/05/2021  Subjective: Pt had fever   Nausea and emesis continues but she reports her back pain has improved. Her fevers have persisted. Last fever was this morning at 0740 and was 100.9. Her Tm was 1700 and was 101.6.   Patient reports normal fetal movement.  She denies uterine contractions, denies bleeding and leaking of fluid per vagina.  Vitals:  Blood pressure 115/65, pulse 83, temperature 99.2 F (37.3 C), resp. rate 16, height 5\' 3"  (1.6 m), weight 80.8 kg, last menstrual period 01/15/2021, SpO2 98 %. Physical Examination: CONSTITUTIONAL: Well-developed, well-nourished female in no acute distress.  HENT:  Normocephalic, atraumatic, External right and left ear normal. Oropharynx is clear and moist EYES: Conjunctivae and EOM are normal. SKIN: Skin is warm and dry. No rash noted. Not diaphoretic. No erythema. No pallor. NEUROLGIC: Alert and oriented to person, place, and time. Normal reflexes, muscle tone coordination. No cranial nerve deficit noted. PSYCHIATRIC: Normal mood and affect. Normal behavior. Normal judgment and thought content. CARDIOVASCULAR: Normal heart rate noted, regular rhythm RESPIRATORY: Effort and breath sounds normal, no problems with respiration noted MUSCULOSKELETAL: Normal range of motion. No edema and no tenderness.  Positve right CVA tenderness ABDOMEN: Soft, nontender, nondistended, gravid. CERVIX: deferred  Fetal monitoring: FHR:  150 bpm, Variability: moderate, Accelerations: Present, Decelerations: Absent    Results for orders placed or performed during the hospital encounter of 10/05/21 (from the past 48 hour(s))  CBC with Differential/Platelet     Status: Abnormal   Collection Time: 10/09/21  4:59 AM  Result Value Ref  Range   WBC 9.1 4.0 - 10.5 K/uL   RBC 3.95 3.87 - 5.11 MIL/uL   Hemoglobin 7.1 (L) 12.0 - 15.0 g/dL    Comment: REPEATED TO VERIFY Reticulocyte Hemoglobin testing may be clinically indicated, consider ordering this additional test 12/07/21    HCT 25.4 (L) 36.0 - 46.0 %   MCV 64.3 (L) 80.0 - 100.0 fL   MCH 18.0 (L) 26.0 - 34.0 pg   MCHC 28.0 (L) 30.0 - 36.0 g/dL   RDW [id number] (H) 53.2 - 99.2 %   Platelets 198 150 - 400 K/uL   nRBC 1.0 (H) 0.0 - 0.2 %   Neutrophils Relative % 66 %   Neutro Abs 7.4 1.7 - 7.7 K/uL   Band Neutrophils 15 %   Lymphocytes Relative 12 %   Lymphs Abs 1.1 0.7 - 4.0 K/uL   Monocytes Relative 7 %   Monocytes Absolute 0.6 0.1 - 1.0 K/uL   Eosinophils Relative 0 %   Eosinophils Absolute 0.0 0.0 - 0.5 K/uL   Basophils Relative 0 %   Basophils Absolute 0.0 0.0 - 0.1 K/uL   nRBC 1 (H) 0 /100 WBC   Abs Immature Granulocytes 0.00 0.00 - 0.07 K/uL   Schistocytes PRESENT    Tear Drop Cells PRESENT    Polychromasia PRESENT     Comment: Performed at [facility], [address] [place], [place]  Folate     Status: None   Collection Time: 10/09/21  4:59 AM  Result Value Ref Range   Folate 21.5 >5.9 ng/mL    Comment: Performed at [facility], [address] [place], [place]    I have reviewed the patient's current  medications.  ASSESSMENT: Principal Problem:   Pyelonephritis affecting pregnancy   PLAN: Monitor fever curve Changed to Cefepime yesterday ID consulted - they have seen the patient. Will repeat her [place] to ensure no change from prior.  May consider repeating urine culture to confirm sterility.  Symptom control of fevers as needed with Tylenol 24 -48 hours afebrile before d/c NST daily  Continue routine antenatal care.   Milas Hock, MD [facility] Faculty Attending, Center for [facility] 10/10/2021 12:08 PM

## 2021-10-11 ENCOUNTER — Inpatient Hospital Stay (HOSPITAL_COMMUNITY): Payer: Medicaid Other

## 2021-10-11 DIAGNOSIS — O411230 Chorioamnionitis, third trimester, not applicable or unspecified: Secondary | ICD-10-CM | POA: Diagnosis not present

## 2021-10-11 DIAGNOSIS — R509 Fever, unspecified: Secondary | ICD-10-CM | POA: Diagnosis not present

## 2021-10-11 DIAGNOSIS — D509 Iron deficiency anemia, unspecified: Secondary | ICD-10-CM

## 2021-10-11 DIAGNOSIS — N1330 Unspecified hydronephrosis: Secondary | ICD-10-CM | POA: Diagnosis not present

## 2021-10-11 DIAGNOSIS — O99013 Anemia complicating pregnancy, third trimester: Secondary | ICD-10-CM

## 2021-10-11 DIAGNOSIS — Z3A37 37 weeks gestation of pregnancy: Secondary | ICD-10-CM | POA: Diagnosis not present

## 2021-10-11 DIAGNOSIS — O2303 Infections of kidney in pregnancy, third trimester: Secondary | ICD-10-CM | POA: Diagnosis not present

## 2021-10-11 DIAGNOSIS — K7689 Other specified diseases of liver: Secondary | ICD-10-CM | POA: Diagnosis not present

## 2021-10-11 DIAGNOSIS — J90 Pleural effusion, not elsewhere classified: Secondary | ICD-10-CM | POA: Diagnosis not present

## 2021-10-11 DIAGNOSIS — M549 Dorsalgia, unspecified: Secondary | ICD-10-CM | POA: Diagnosis not present

## 2021-10-11 DIAGNOSIS — R188 Other ascites: Secondary | ICD-10-CM | POA: Diagnosis not present

## 2021-10-11 DIAGNOSIS — O2343 Unspecified infection of urinary tract in pregnancy, third trimester: Secondary | ICD-10-CM | POA: Diagnosis not present

## 2021-10-11 DIAGNOSIS — O99891 Other specified diseases and conditions complicating pregnancy: Secondary | ICD-10-CM | POA: Diagnosis not present

## 2021-10-11 DIAGNOSIS — N12 Tubulo-interstitial nephritis, not specified as acute or chronic: Secondary | ICD-10-CM | POA: Diagnosis not present

## 2021-10-11 LAB — CBC WITH DIFFERENTIAL/PLATELET
Abs Immature Granulocytes: 0.49 10*3/uL — ABNORMAL HIGH (ref 0.00–0.07)
Basophils Absolute: 0 10*3/uL (ref 0.0–0.1)
Basophils Relative: 0 %
Eosinophils Absolute: 0 10*3/uL (ref 0.0–0.5)
Eosinophils Relative: 0 %
HCT: 29.2 % — ABNORMAL LOW (ref 36.0–46.0)
Hemoglobin: 8.1 g/dL — ABNORMAL LOW (ref 12.0–15.0)
Immature Granulocytes: 4 %
Lymphocytes Relative: 26 %
Lymphs Abs: 3 10*3/uL (ref 0.7–4.0)
MCH: 18.3 pg — ABNORMAL LOW (ref 26.0–34.0)
MCHC: 27.7 g/dL — ABNORMAL LOW (ref 30.0–36.0)
MCV: 65.9 fL — ABNORMAL LOW (ref 80.0–100.0)
Monocytes Absolute: 0.8 10*3/uL (ref 0.1–1.0)
Monocytes Relative: 7 %
Neutro Abs: 7.3 10*3/uL (ref 1.7–7.7)
Neutrophils Relative %: 63 %
Platelets: 197 10*3/uL (ref 150–400)
RBC: 4.43 MIL/uL (ref 3.87–5.11)
RDW: 30.7 % — ABNORMAL HIGH (ref 11.5–15.5)
WBC: 11.6 10*3/uL — ABNORMAL HIGH (ref 4.0–10.5)
nRBC: 0.7 % — ABNORMAL HIGH (ref 0.0–0.2)

## 2021-10-11 MED ORDER — ACETAMINOPHEN 160 MG/5ML PO SOLN
650.0000 mg | ORAL | Status: DC | PRN
  Administered 2021-10-11 – 2021-10-14 (×13): 650 mg via ORAL
  Filled 2021-10-11 (×14): qty 20.3

## 2021-10-11 MED ORDER — BENZONATATE 100 MG PO CAPS
100.0000 mg | ORAL_CAPSULE | Freq: Two times a day (BID) | ORAL | Status: DC | PRN
  Administered 2021-10-11 – 2021-10-14 (×2): 100 mg via ORAL
  Filled 2021-10-11 (×3): qty 1

## 2021-10-11 MED ORDER — FUROSEMIDE 10 MG/ML IJ SOLN
20.0000 mg | Freq: Once | INTRAMUSCULAR | Status: AC
  Administered 2021-10-11: 20 mg via INTRAVENOUS
  Filled 2021-10-11: qty 2

## 2021-10-11 NOTE — Progress Notes (Addendum)
Subjective: No new complaints   Antibiotics:  Anti-infectives (From admission, onward)    Start     Dose/Rate Route Frequency Ordered Stop   10/10/21 1400  ceFAZolin (ANCEF) IVPB 1 g/50 mL premix        1 g 100 mL/hr over 30 Minutes Intravenous Every 8 hours 10/10/21 1024     10/09/21 0900  ceFEPIme (MAXIPIME) 2 g in sodium chloride 0.9 % 100 mL IVPB  Status:  Discontinued        2 g 200 mL/hr over 30 Minutes Intravenous Every 8 hours 10/09/21 0807 10/10/21 1024   10/05/21 2100  cefTRIAXone (ROCEPHIN) 2 g in sodium chloride 0.9 % 100 mL IVPB  Status:  Discontinued        2 g 200 mL/hr over 30 Minutes Intravenous Every 24 hours 10/05/21 2048 10/09/21 0807       Medications: Scheduled Meds:  docusate sodium  100 mg Oral BID   metoCLOPramide (REGLAN) injection  5 mg Intravenous Q6H   ondansetron (ZOFRAN) IV  4 mg Intravenous Q6H   prenatal multivitamin  1 tablet Oral Q1200   Continuous Infusions:   ceFAZolin (ANCEF) IV 1 g (10/11/21 1418)   PRN Meds:.acetaminophen (TYLENOL) oral liquid 160 mg/5 mL, acetaminophen, calcium carbonate, oxyCODONE, zolpidem    Objective: Weight change:   Intake/Output Summary (Last 24 hours) at 10/11/2021 1438 Last data filed at 10/11/2021 1307 Gross per 24 hour  Intake 4822.78 ml  Output 3600 ml  Net 1222.78 ml   Blood pressure 127/71, pulse 87, temperature 99.4 F (37.4 C), temperature source Oral, resp. rate 16, height 5\' 3"  (1.6 m), weight 80.8 kg, last menstrual period 01/15/2021, SpO2 97 %. Temp:  [98.3 F (36.8 C)-102.8 F (39.3 C)] 99.4 F (37.4 C) (01/07 1256) Pulse Rate:  [83-112] 87 (01/07 1140) Resp:  [16-18] 16 (01/07 1140) BP: (115-134)/(63-78) 127/71 (01/07 1140) SpO2:  [97 %-100 %] 97 % (01/07 1140)  Physical Exam: Physical Exam Constitutional:      General: She is not in acute distress.    Appearance: She is well-developed. She is not diaphoretic.  HENT:     Head: Normocephalic and atraumatic.     Right  Ear: External ear normal.     Left Ear: External ear normal.     Mouth/Throat:     Pharynx: No oropharyngeal exudate.  Eyes:     General: No scleral icterus.    Conjunctiva/sclera: Conjunctivae normal.     Pupils: Pupils are equal, round, and reactive to light.  Cardiovascular:     Rate and Rhythm: Normal rate and regular rhythm.     Heart sounds: Normal heart sounds. No murmur heard.   No friction rub.  Pulmonary:     Effort: Pulmonary effort is normal. No respiratory distress.     Breath sounds: Normal breath sounds. No wheezing or rales.  Abdominal:     General: Bowel sounds are normal. There is no distension.     Palpations: Abdomen is soft.     Tenderness: There is no abdominal tenderness.  Musculoskeletal:        General: No tenderness. Normal range of motion.  Lymphadenopathy:     Cervical: No cervical adenopathy.  Skin:    General: Skin is warm and dry.     Coloration: Skin is not pale.     Findings: No erythema or rash.  Neurological:     General: No focal deficit present.     Mental  Status: She is alert and oriented to person, place, and time.     Motor: No abnormal muscle tone.     Coordination: Coordination normal.  Psychiatric:        Mood and Affect: Mood normal.        Behavior: Behavior normal.        Thought Content: Thought content normal.        Judgment: Judgment normal.     CBC:    BMET No results for input(s): NA, K, CL, CO2, GLUCOSE, BUN, CREATININE, CALCIUM in the last 72 hours.   Liver Panel  No results for input(s): PROT, ALBUMIN, AST, ALT, ALKPHOS, BILITOT, BILIDIR, IBILI in the last 72 hours.     Sedimentation Rate No results for input(s): ESRSEDRATE in the last 72 hours. C-Reactive Protein No results for input(s): CRP in the last 72 hours.  Micro Results: Recent Results (from the past 720 hour(s))  Culture, beta strep (group b only)     Status: Abnormal   Collection Time: 09/24/21  4:03 PM   Specimen: Vaginal/Rectal; Genital    VR  Result Value Ref Range Status   Strep Gp B Culture Positive (A) Negative Final    Comment: Centers for Disease Control and Prevention (CDC) and American Congress of Obstetricians and Gynecologists (ACOG) guidelines for prevention of perinatal group B streptococcal (GBS) disease specify co-collection of a vaginal and rectal swab specimen to maximize sensitivity of GBS detection. Per the CDC and ACOG, swabbing both the lower vagina and rectum substantially increases the yield of detection compared with sampling the vagina alone. Penicillin G, ampicillin, or cefazolin are indicated for intrapartum prophylaxis of perinatal GBS colonization. Reflex susceptibility testing should be performed prior to use of clindamycin only on GBS isolates from penicillin-allergic women who are considered a high risk for anaphylaxis. Treatment with vancomycin without additional testing is warranted if resistance to clindamycin is noted.   Resp Panel by RT-PCR (Flu A&B, Covid) Nasopharyngeal Swab     Status: None   Collection Time: 10/04/21  7:35 PM   Specimen: Nasopharyngeal Swab; Nasopharyngeal(NP) swabs in vial transport medium  Result Value Ref Range Status   SARS Coronavirus 2 by RT PCR NEGATIVE NEGATIVE Final    Comment: (NOTE) SARS-CoV-2 target nucleic acids are NOT DETECTED.  The SARS-CoV-2 RNA is generally detectable in upper respiratory specimens during the acute phase of infection. The lowest concentration of SARS-CoV-2 viral copies this assay can detect is 138 copies/mL. A negative result does not preclude SARS-Cov-2 infection and should not be used as the sole basis for treatment or other patient management decisions. A negative result may occur with  improper specimen collection/handling, submission of specimen other than nasopharyngeal swab, presence of viral mutation(s) within the areas targeted by this assay, and inadequate number of viral copies(<138 copies/mL). A negative result  must be combined with clinical observations, patient history, and epidemiological information. The expected result is Negative.  Fact Sheet for Patients:  EntrepreneurPulse.com.au  Fact Sheet for Healthcare Providers:  IncredibleEmployment.be  This test is no t yet approved or cleared by the [place] FDA and  has been authorized for detection and/or diagnosis of SARS-CoV-2 by FDA under an Emergency Use Authorization (EUA). This EUA will remain  in effect (meaning this test can be used) for the duration of the COVID-19 declaration under Section 564(b)(1) of the Act, 21 U.S.C.section 360bbb-3(b)(1), unless the authorization is terminated  or revoked sooner.       Influenza A by PCR NEGATIVE  NEGATIVE Final   Influenza B by PCR NEGATIVE NEGATIVE Final    Comment: (NOTE) The Xpert Xpress SARS-CoV-2/FLU/RSV plus assay is intended as an aid in the diagnosis of influenza from Nasopharyngeal swab specimens and should not be used as a sole basis for treatment. Nasal washings and aspirates are unacceptable for Xpert Xpress SARS-CoV-2/FLU/RSV testing.  Fact Sheet for Patients: EntrepreneurPulse.com.au  Fact Sheet for Healthcare Providers: IncredibleEmployment.be  This test is not yet approved or cleared by the [place] FDA and has been authorized for detection and/or diagnosis of SARS-CoV-2 by FDA under an Emergency Use Authorization (EUA). This EUA will remain in effect (meaning this test can be used) for the duration of the COVID-19 declaration under Section 564(b)(1) of the Act, 21 U.S.C. section 360bbb-3(b)(1), unless the authorization is terminated or revoked.  Performed at [facility], [place] [address] [place], [place] [address]   OB Urine Culture     Status: Abnormal   Collection Time: 10/05/21  8:41 PM   Specimen: OB Clean Catch; Urine  Result Value Ref Range Status   Specimen  Description OB CLEAN CATCH  Final   Special Requests NONE  Final   Culture (A)  Final    >=100,000 COLONIES/mL ESCHERICHIA COLI NO GROUP B STREP (S.AGALACTIAE) ISOLATED Performed at [facility], [place] [address], [place], [place] [address]    Report Status 10/08/2021 FINAL  Final   Organism ID, Bacteria ESCHERICHIA COLI (A)  Final      Susceptibility   Escherichia coli - MIC*    AMPICILLIN <=2 SENSITIVE Sensitive     CEFAZOLIN <=4 SENSITIVE Sensitive     CEFEPIME <=0.12 SENSITIVE Sensitive     CEFTAZIDIME <=1 SENSITIVE Sensitive     CEFTRIAXONE <=0.25 SENSITIVE Sensitive     CIPROFLOXACIN <=0.25 SENSITIVE Sensitive     GENTAMICIN <=1 SENSITIVE Sensitive     IMIPENEM <=0.25 SENSITIVE Sensitive     TRIMETH/SULFA <=20 SENSITIVE Sensitive     AMPICILLIN/SULBACTAM <=2 SENSITIVE Sensitive     PIP/TAZO <=4 SENSITIVE Sensitive     * >=100,000 COLONIES/mL ESCHERICHIA COLI    Studies/Results: MR ABDOMEN WO CONTRAST  Result Date: 10/11/2021 CLINICAL DATA:  Pyelonephritis. EXAM: MRI ABDOMEN WITHOUT CONTRAST TECHNIQUE: Multiplanar multisequence MR imaging was performed without the administration of intravenous contrast. COMPARISON:  Renal sonogram 10/10/2021 FINDINGS: Lower chest: Small bilateral pleural effusions. Interlobular septal thickening identified concerning for mild interstitial edema. Hepatobiliary: Small cystic lesion within the lateral right hepatic lobe measures 6 mm, image 17/8. Partially collapsed gallbladder is unremarkable. No signs of bile duct dilatation. Pancreas: No mass, inflammatory changes, or other parenchymal abnormality identified. Spleen:  Within normal limits in size and appearance. Adrenals/Urinary Tract:  The adrenal glands are normal. Mild bilateral hydronephrosis identified. On the diffusion-weighted sequences there are multiple peripheral segmental cortical areas of increased signal within the right kidney compatible with pyelonephritis, image 99/10, image 105/10  and image 109/10. This is compatible with pyelonephritis. Single segmental area of peripheral increased signal on the diffusion-weighted sequence noted within the posterior cortex of the interpolar left kidney. Also compatible with pyelonephritis. No signs of renal abscess identified bilaterally. Stomach/Bowel: No dilated bowel loops identified. Vascular/Lymphatic: Visualized portions within the abdomen are unremarkable. Other: Diffuse body wall edema is identified. Trace perihepatic ascites. No fluid collections identified. Gravid uterus is identified consistent with [redacted] week gestation Musculoskeletal: No suspicious bone lesions identified. IMPRESSION: 1. Imaging findings compatible with bilateral pyelonephritis, right greater than left. No signs of renal abscess. 2.  Mild bilateral hydronephrosis. 3. Gravid uterus consistent with [redacted] week gestation. 4. Small pleural effusions and mild interstitial pulmonary edema. There is also diffuse body wall edema. Electronically Signed   By: Kerby Moors M.D.   On: 10/11/2021 11:51   US RENAL  Result Date: 10/10/2021 CLINICAL DATA:  Pyelonephritis.  Third trimester pregnancy. EXAM: RENAL / URINARY TRACT ULTRASOUND COMPLETE COMPARISON:  Renal ultrasound 10/07/2021 FINDINGS: Right Kidney: Renal measurements: 11.7 x 6.1 x 3.4 cm = volume: 124 mL. Echogenicity within normal limits. No mass identified. There is mild-to-moderate right hydronephrosis, changed from prior. Left Kidney: Renal measurements: 11.1 x 5.9 x 3.0 cm = volume: 103 mL. Echogenicity within normal limits. No mass identified. Bladder: Appears normal for degree of bladder distention. Prevoid urinary bladder volume: 74 mL. Patient reported she did not have urge to urinate and no postvoid measurement was performed. Other: None. IMPRESSION: Bilateral mild-to-moderate hydronephrosis, mildly greater on the right. This is not significantly changed from 10/07/2021. This again is compatible with maternal hydronephrosis  in pregnancy. Electronically Signed   By: Yvonne Kendall   On: 10/10/2021 14:01      Assessment/Plan:  INTERVAL HISTORY: Fevers persist MRI of the abdomen does not show any intra-abdominal abscess   Principal Problem:   Pyelonephritis affecting pregnancy Active Problems:   [redacted] weeks gestation of pregnancy   Back pain affecting pregnancy in third trimester   Pyelonephritis   Urinary tract infection affecting care of mother in third trimester, antepartum   FUO (fever of unknown origin)   Pleural effusion due to another disorder    Dawn Small is a 26 y.o. female pregnant at [redacted] weeks gestation admitted with fevers chills nausea dysuria and evidence of right-sided pyelonephritis.  She has had persistent fevers despite being on antibiotics that have all been active against her fairly pansensitive E. Coli  #1 FUO:  Ultrasound of the abdomen failed to show evidence of a renal abscess and MRI of the abdomen has been formed also does not show evidence of a renal or intra-abdominal abscess.  Does show inflammation in the bladder but this is consistent with her known UTI and is certainly not suggestive of unresolved infection.  She is already received nearly 6 days of active antibiotics and I would not give her more than 7 days for urinary tract infection.  Her urine culture has been resent but she has no urinary symptomatology present.  Unless we find a new target for antibiotics I will plan on stopping them tomorrow.  We have sent off a 20 virus respiratory panel to check to see if she may have possibly had a viral infection that she was harboring or acquired in the interim.  Her COVID testing was negative on admission but she certainly could have acquired COVID and since she has been here or have another respiratory virus.  I did order vascular Dopplers of her upper and lower extremities to look for deep venous thrombosis as part of an FUO work-up.  #2 Pleural effusions: These are  small and bilateral and likely transudative.    I spent 36 minutes with the patient including than 50% of the time in face to face counseling of the patient guarding her FUO work-up personally MRI of the abdomen ultrasound abdomen updated CBC BMP r along with review of medical records in preparation for the visit and during the visit and in coordination of her care with Dr. Damita Dunnings.   LOS: 6 days   Alcide Evener 10/11/2021, 2:38 PM

## 2021-10-11 NOTE — Progress Notes (Signed)
FACULTY PRACTICE ANTEPARTUM PROGRESS NOTE  Dawn Small is a 26 y.o. [id number] at [redacted]w[redacted]d who is admitted for pyelonephritis.  Estimated Date of Delivery: 10/22/21 Fetal presentation is cephalic.  Length of Stay:  6 Days. Admitted 10/05/2021  Subjective: Pt had fever overnight.   Nausea improved. Back pain improved. No abdominal pain. No other localizing symptoms.    Patient reports normal fetal movement.  She denies uterine contractions, denies bleeding and leaking of fluid per vagina.  Vitals:  Blood pressure 127/73, pulse (!) 108, temperature 98.3 F (36.8 C), temperature source Oral, resp. rate 16, height 5\' 3"  (1.6 m), weight 80.8 kg, last menstrual period 01/15/2021, SpO2 99 %. Physical Examination: CONSTITUTIONAL: Well-developed, well-nourished female in no acute distress.  HENT:  Normocephalic, atraumatic, External right and left ear normal. Oropharynx is clear and moist EYES: Conjunctivae and EOM are normal. SKIN: Skin is warm and dry. No rash noted. Not diaphoretic. No erythema. No pallor. [place]: Alert and oriented to person, place, and time. Normal reflexes, muscle tone coordination. No cranial nerve deficit noted. PSYCHIATRIC: Normal mood and affect. Normal behavior. Normal judgment and thought content. CARDIOVASCULAR: Normal heart rate noted, regular rhythm RESPIRATORY: Effort and breath sounds normal, no problems with respiration noted MUSCULOSKELETAL: Normal range of motion. No edema and no tenderness.  Positve right CVA tenderness ABDOMEN: Soft, nontender, nondistended, gravid. CERVIX: deferred  Fetal monitoring: FHR:  150 bpm, Variability: moderate, Accelerations: Present, Decelerations: Absent    Results for orders placed or performed during the hospital encounter of 10/05/21 (from the past 48 hour(s))  CBC with Differential/Platelet     Status: Abnormal   Collection Time: 10/11/21  9:22 AM  Result Value Ref Range   WBC 11.6 (H) 4.0 - 10.5 K/uL   RBC 4.43 3.87 -  5.11 MIL/uL   Hemoglobin 8.1 (L) 12.0 - 15.0 g/dL    Comment: Reticulocyte Hemoglobin testing may be clinically indicated, consider ordering this additional test [id number]    HCT 29.2 (L) 36.0 - 46.0 %   MCV 65.9 (L) 80.0 - 100.0 fL   MCH 18.3 (L) 26.0 - 34.0 pg   MCHC 27.7 (L) 30.0 - 36.0 g/dL   RDW 30.7 (H) 11.5 - 15.5 %   Platelets 197 150 - 400 K/uL    Comment: REPEATED TO VERIFY   nRBC 0.7 (H) 0.0 - 0.2 %   Neutrophils Relative % 63 %   Neutro Abs 7.3 1.7 - 7.7 K/uL   Lymphocytes Relative 26 %   Lymphs Abs 3.0 0.7 - 4.0 K/uL   Monocytes Relative 7 %   Monocytes Absolute 0.8 0.1 - 1.0 K/uL   Eosinophils Relative 0 %   Eosinophils Absolute 0.0 0.0 - 0.5 K/uL   Basophils Relative 0 %   Basophils Absolute 0.0 0.0 - 0.1 K/uL   WBC Morphology MILD LEFT SHIFT (1-5% METAS, OCC MYELO, OCC BANDS)    Smear Review MORPHOLOGY UNREMARKABLE    Immature Granulocytes 4 %   Abs Immature Granulocytes 0.49 (H) 0.00 - 0.07 K/uL   Tear Drop Cells PRESENT    Target Cells PRESENT    Ovalocytes PRESENT     Comment: Performed at [facility], [address] [place], [place] [address]    I have reviewed the patient's current medications.  ASSESSMENT: Principal Problem:   Pyelonephritis affecting pregnancy Active Problems:   [redacted] weeks gestation of pregnancy   Back pain affecting pregnancy in third trimester   Pyelonephritis   Urinary tract infection affecting care of  mother in third trimester, antepartum   FUO (fever of unknown origin)   PLAN: Monitor fever curve Changed to Cefepime 1/5 ID consulted - appreciate their input. Plan is to do expanded respiratory panel (she is asymptomatic) and do MRI of the abdomen in case renal US is missing possible kidney abscess.  Symptom control of fevers as needed with Tylenol 24 -48 hours afebrile before d/c NST daily  Continue routine antenatal care.   Radene Gunning, MD [place] Attending, [facility] 10/11/2021 11:28  AM

## 2021-10-11 NOTE — Plan of Care (Signed)
°  Problem: Education: Goal: Knowledge of disease or condition will improve Outcome: Progressing Goal: Knowledge of the prescribed therapeutic regimen will improve Outcome: Progressing Goal: Individualized Educational Video(s) Outcome: Progressing   Problem: Clinical Measurements: Goal: Complications related to the disease process, condition or treatment will be avoided or minimized Outcome: Progressing   Problem: Urinary Elimination: Goal: Signs and symptoms of infection will decrease Outcome: Progressing

## 2021-10-11 NOTE — Progress Notes (Signed)
VASCULAR LAB    Sent for patient via transport after speaking with Crystal, RN to make sure patient stable to come.  Once transport arrived, patient refused testing until her spouse arrives.  RN to reach out to the vascular lab when patient is agreeable to testing.   Betrice Wanat, RVT 10/11/2021, 9:00 AM

## 2021-10-12 ENCOUNTER — Inpatient Hospital Stay (HOSPITAL_COMMUNITY): Payer: Medicaid Other

## 2021-10-12 DIAGNOSIS — O2343 Unspecified infection of urinary tract in pregnancy, third trimester: Secondary | ICD-10-CM | POA: Diagnosis not present

## 2021-10-12 DIAGNOSIS — R509 Fever, unspecified: Secondary | ICD-10-CM | POA: Diagnosis not present

## 2021-10-12 DIAGNOSIS — O2303 Infections of kidney in pregnancy, third trimester: Secondary | ICD-10-CM | POA: Diagnosis not present

## 2021-10-12 DIAGNOSIS — Z3A37 37 weeks gestation of pregnancy: Secondary | ICD-10-CM | POA: Diagnosis not present

## 2021-10-12 DIAGNOSIS — O99891 Other specified diseases and conditions complicating pregnancy: Secondary | ICD-10-CM | POA: Diagnosis not present

## 2021-10-12 DIAGNOSIS — D509 Iron deficiency anemia, unspecified: Secondary | ICD-10-CM | POA: Diagnosis not present

## 2021-10-12 DIAGNOSIS — O99013 Anemia complicating pregnancy, third trimester: Secondary | ICD-10-CM | POA: Diagnosis not present

## 2021-10-12 DIAGNOSIS — M7989 Other specified soft tissue disorders: Secondary | ICD-10-CM

## 2021-10-12 DIAGNOSIS — J90 Pleural effusion, not elsewhere classified: Secondary | ICD-10-CM

## 2021-10-12 DIAGNOSIS — O411230 Chorioamnionitis, third trimester, not applicable or unspecified: Secondary | ICD-10-CM | POA: Diagnosis not present

## 2021-10-12 DIAGNOSIS — N12 Tubulo-interstitial nephritis, not specified as acute or chronic: Secondary | ICD-10-CM | POA: Diagnosis not present

## 2021-10-12 DIAGNOSIS — M549 Dorsalgia, unspecified: Secondary | ICD-10-CM | POA: Diagnosis not present

## 2021-10-12 LAB — COMPREHENSIVE METABOLIC PANEL
ALT: 34 U/L (ref 0–44)
AST: 108 U/L — ABNORMAL HIGH (ref 15–41)
Albumin: 1.7 g/dL — ABNORMAL LOW (ref 3.5–5.0)
Alkaline Phosphatase: 95 U/L (ref 38–126)
Anion gap: 10 (ref 5–15)
BUN: <5 mg/dL — ABNORMAL LOW (ref 6–20)
CO2: 24 mmol/L (ref 22–32)
Calcium: 7.6 mg/dL — ABNORMAL LOW (ref 8.9–10.3)
Chloride: 100 mmol/L (ref 98–111)
Creatinine, Ser: 0.95 mg/dL (ref 0.44–1.00)
GFR, Estimated: >60 mL/min (ref >60)
Glucose, Bld: 80 mg/dL (ref 70–99)
Potassium: 2.9 mmol/L — ABNORMAL LOW (ref 3.5–5.1)
Sodium: 134 mmol/L — ABNORMAL LOW (ref 135–145)
Total Bilirubin: 0.9 mg/dL (ref 0.3–1.2)
Total Protein: 5.1 g/dL — ABNORMAL LOW (ref 6.5–8.1)

## 2021-10-12 LAB — RESPIRATORY PANEL BY PCR

## 2021-10-12 LAB — LACTATE DEHYDROGENASE: LDH: 299 U/L — ABNORMAL HIGH (ref 98–192)

## 2021-10-12 LAB — CBC WITH DIFFERENTIAL/PLATELET
Abs Immature Granulocytes: 0.2 10*3/uL — ABNORMAL HIGH (ref 0.00–0.07)
Band Neutrophils: 15 %
Basophils Absolute: 0.1 10*3/uL (ref 0.0–0.1)
Basophils Relative: 1 %
Eosinophils Absolute: 0.1 10*3/uL (ref 0.0–0.5)
Eosinophils Relative: 1 %
HCT: 27.9 % — ABNORMAL LOW (ref 36.0–46.0)
Hemoglobin: 7.9 g/dL — ABNORMAL LOW (ref 12.0–15.0)
Lymphocytes Relative: 16 %
Lymphs Abs: 1.5 10*3/uL (ref 0.7–4.0)
MCH: 18.5 pg — ABNORMAL LOW (ref 26.0–34.0)
MCHC: 28.3 g/dL — ABNORMAL LOW (ref 30.0–36.0)
MCV: 65.5 fL — ABNORMAL LOW (ref 80.0–100.0)
Metamyelocytes Relative: 1 %
Monocytes Absolute: 0.4 10*3/uL (ref 0.1–1.0)
Monocytes Relative: 4 %
Myelocytes: 1 %
Neutro Abs: 7.2 10*3/uL (ref 1.7–7.7)
Neutrophils Relative %: 61 %
Platelets: 196 10*3/uL (ref 150–400)
RBC: 4.26 MIL/uL (ref 3.87–5.11)
RDW: 30.9 % — ABNORMAL HIGH (ref 11.5–15.5)
WBC: 9.5 10*3/uL (ref 4.0–10.5)
nRBC: 0.8 % — ABNORMAL HIGH (ref 0.0–0.2)
nRBC: 2 /100 WBC — ABNORMAL HIGH

## 2021-10-12 LAB — URINE CULTURE: Culture: NO GROWTH

## 2021-10-12 MED ORDER — SODIUM CHLORIDE 0.9 % IV SOLN
500.0000 mg | Freq: Once | INTRAVENOUS | Status: AC
  Administered 2021-10-12: 500 mg via INTRAVENOUS
  Filled 2021-10-12 (×2): qty 25

## 2021-10-12 MED ORDER — POTASSIUM CHLORIDE 20 MEQ PO PACK
20.0000 meq | PACK | Freq: Three times a day (TID) | ORAL | Status: AC
  Administered 2021-10-12 (×3): 20 meq via ORAL
  Filled 2021-10-12 (×3): qty 1

## 2021-10-12 NOTE — Progress Notes (Signed)
NST shows fetal tachycardia in s/o temp of 100.3.  FHT baseline improving to 165, mod var, + accels, no decels Ctxns noted Q3 but not felt by pt and normal at term.   Discussed with Marchelle Folks: Will continue monitoring until FHT return to baseline. She was given Tylenol at 1045.

## 2021-10-12 NOTE — Progress Notes (Addendum)
FACULTY PRACTICE ANTEPARTUM PROGRESS NOTE  Dawn Small is a 26 y.o. [id number] at [redacted]w[redacted]d who is admitted for pyelonephritis.  Estimated Date of Delivery: 10/22/21 Fetal presentation is cephalic.  Length of Stay:  7 Days. Admitted 10/05/2021  Subjective: Pt had fever intermittently in the last 24 hours last this morning.   Nausea improved. Back pain improved. No abdominal pain. No other localizing symptoms.    Patient reports normal fetal movement.  She denies uterine contractions, denies bleeding and leaking of fluid per vagina.  Vitals:  Blood pressure (!) 110/59, pulse 90, temperature 100.3 F (37.9 C), resp. rate 17, height 5\' 3"  (1.6 m), weight 80.8 kg, last menstrual period 01/15/2021, SpO2 96 %. Physical Examination: CONSTITUTIONAL: Well-developed, well-nourished female in no acute distress.  HENT:  Normocephalic, atraumatic, External right and left ear normal. Oropharynx is clear and moist EYES: Conjunctivae and EOM are normal. SKIN: Skin is warm and dry. No rash noted. Not diaphoretic. No erythema. No pallor. [place]: Alert and oriented to person, place, and time. Normal reflexes, muscle tone coordination. No cranial nerve deficit noted. PSYCHIATRIC: Normal mood and affect. Normal behavior. Normal judgment and thought content. CARDIOVASCULAR: Normal heart rate noted, regular rhythm RESPIRATORY: Effort and breath sounds normal, no problems with respiration noted MUSCULOSKELETAL: Normal range of motion. No edema and no tenderness.  Positve right CVA tenderness ABDOMEN: Soft, nontender, nondistended, gravid.  CERVIX: deferred  Fetal monitoring: FHR:  165 bpm, Variability: moderate, Accelerations: Present, Decelerations: Absent    Results for orders placed or performed during the hospital encounter of 10/05/21 (from the past 48 hour(s))  CBC with Differential/Platelet     Status: Abnormal   Collection Time: 10/11/21  9:22 AM  Result Value Ref Range   WBC 11.6 (H) 4.0 - 10.5 K/uL    RBC 4.43 3.87 - 5.11 MIL/uL   Hemoglobin 8.1 (L) 12.0 - 15.0 g/dL    Comment: Reticulocyte Hemoglobin testing may be clinically indicated, consider ordering this additional test [id number]    HCT 29.2 (L) 36.0 - 46.0 %   MCV 65.9 (L) 80.0 - 100.0 fL   MCH 18.3 (L) 26.0 - 34.0 pg   MCHC 27.7 (L) 30.0 - 36.0 g/dL   RDW 30.7 (H) 11.5 - 15.5 %   Platelets 197 150 - 400 K/uL    Comment: REPEATED TO VERIFY   nRBC 0.7 (H) 0.0 - 0.2 %   Neutrophils Relative % 63 %   Neutro Abs 7.3 1.7 - 7.7 K/uL   Lymphocytes Relative 26 %   Lymphs Abs 3.0 0.7 - 4.0 K/uL   Monocytes Relative 7 %   Monocytes Absolute 0.8 0.1 - 1.0 K/uL   Eosinophils Relative 0 %   Eosinophils Absolute 0.0 0.0 - 0.5 K/uL   Basophils Relative 0 %   Basophils Absolute 0.0 0.0 - 0.1 K/uL   WBC Morphology MILD LEFT SHIFT (1-5% METAS, OCC MYELO, OCC BANDS)    Smear Review MORPHOLOGY UNREMARKABLE    Immature Granulocytes 4 %   Abs Immature Granulocytes 0.49 (H) 0.00 - 0.07 K/uL   Tear Drop Cells PRESENT    Target Cells PRESENT    Ovalocytes PRESENT     Comment: Performed at [facility], [address] [place], [place] [address]  CBC with Differential/Platelet     Status: Abnormal   Collection Time: 10/12/21  4:45 AM  Result Value Ref Range   WBC 9.5 4.0 - 10.5 K/uL   RBC 4.26 3.87 - 5.11 MIL/uL   Hemoglobin  7.9 (L) 12.0 - 15.0 g/dL    Comment: Reticulocyte Hemoglobin testing may be clinically indicated, consider ordering this additional test [id number]    HCT 27.9 (L) 36.0 - 46.0 %   MCV 65.5 (L) 80.0 - 100.0 fL   MCH 18.5 (L) 26.0 - 34.0 pg   MCHC 28.3 (L) 30.0 - 36.0 g/dL   RDW 30.9 (H) 11.5 - 15.5 %   Platelets 196 150 - 400 K/uL   nRBC 0.8 (H) 0.0 - 0.2 %   Neutrophils Relative % 61 %   Neutro Abs 7.2 1.7 - 7.7 K/uL   Band Neutrophils 15 %   Lymphocytes Relative 16 %   Lymphs Abs 1.5 0.7 - 4.0 K/uL   Monocytes Relative 4 %   Monocytes Absolute 0.4 0.1 - 1.0 K/uL   Eosinophils Relative 1 %    Eosinophils Absolute 0.1 0.0 - 0.5 K/uL   Basophils Relative 1 %   Basophils Absolute 0.1 0.0 - 0.1 K/uL   nRBC 2 (H) 0 /100 WBC   Metamyelocytes Relative 1 %   Myelocytes 1 %   Abs Immature Granulocytes 0.20 (H) 0.00 - 0.07 K/uL   Tear Drop Cells PRESENT    Polychromasia PRESENT     Comment: Performed at [facility], [address], [place], [place] [address]  Comprehensive metabolic panel     Status: Abnormal   Collection Time: 10/12/21  4:45 AM  Result Value Ref Range   Sodium 134 (L) 135 - 145 mmol/L   Potassium 2.9 (L) 3.5 - 5.1 mmol/L   Chloride 100 98 - 111 mmol/L   CO2 24 22 - 32 mmol/L   Glucose, Bld 80 70 - 99 mg/dL    Comment: Glucose reference range applies only to samples taken after fasting for at least 8 hours.   BUN <5 (L) 6 - 20 mg/dL   Creatinine, Ser 0.95 0.44 - 1.00 mg/dL   Calcium 7.6 (L) 8.9 - 10.3 mg/dL   Total Protein 5.1 (L) 6.5 - 8.1 g/dL   Albumin 1.7 (L) 3.5 - 5.0 g/dL   AST 108 (H) 15 - 41 U/L   ALT 34 0 - 44 U/L   Alkaline Phosphatase 95 38 - 126 U/L   Total Bilirubin 0.9 0.3 - 1.2 mg/dL   GFR, Estimated >60 >60 mL/min    Comment: (NOTE) Calculated using the CKD-EPI Creatinine Equation (2021)    Anion gap 10 5 - 15    Comment: Performed at [facility], [place] [address] [place], [place] [address]    I have reviewed the patient's current medications.  ASSESSMENT: Principal Problem:   Pyelonephritis affecting pregnancy Active Problems:   [redacted] weeks gestation of pregnancy   Back pain affecting pregnancy in third trimester   Pyelonephritis   Urinary tract infection affecting care of mother in third trimester, antepartum   FUO (fever of unknown origin)   Pleural effusion due to another disorder   PLAN: Pyelo/FUO - MRI/Renal US negative for abscess. Reviewed MRI with radiology, appendix not visualized but also of low clinical suspicion.  - Discussed chorio is low clinical suspicion - she has no clinical or physical exam findings  concerning for this. Reviewed with Dr. Donalee Citrin who does agree. - If still persistent fever at 39w, would deliver at that time - Symptom control with tylenol - Check respiratory panel today - sent - Repeat Ucx pending (expect it to be normal) - LE and UE dopplers today as a precaution  FWB - Continue NST daily  MOD - Anticipate IOL at 39w but preferably 18 hours without a fever prior to induction for better response so if this would mean delaying a day to [redacted]w[redacted]d this would be preferred per discussion with MFM  4. Hypokalemia - PO repletion today, recheck BMP tomorrow  5. Anemia - Chronic anemia - iron def and alpha thal trait. Last venofer was 12/19, will re-dose 1/8  Continue routine antenatal care.   Radene Gunning, MD [place] Attending, [facility] 10/12/2021 10:49 AM

## 2021-10-12 NOTE — Progress Notes (Signed)
VASCULAR LAB    Bilateral upper and lower extremity venous Dopplers have been performed.  See CV proc for preliminary results.   Arch Methot, RVT 10/12/2021, 4:29 PM

## 2021-10-12 NOTE — Progress Notes (Signed)
Subjective: No new complaints   Antibiotics:  Anti-infectives (From admission, onward)    Start     Dose/Rate Route Frequency Ordered Stop   10/10/21 1400  ceFAZolin (ANCEF) IVPB 1 g/50 mL premix  Status:  Discontinued        1 g 100 mL/hr over 30 Minutes Intravenous Every 8 hours 10/10/21 1024 10/12/21 1320   10/09/21 0900  ceFEPIme (MAXIPIME) 2 g in sodium chloride 0.9 % 100 mL IVPB  Status:  Discontinued        2 g 200 mL/hr over 30 Minutes Intravenous Every 8 hours 10/09/21 0807 10/10/21 1024   10/05/21 2100  cefTRIAXone (ROCEPHIN) 2 g in sodium chloride 0.9 % 100 mL IVPB  Status:  Discontinued        2 g 200 mL/hr over 30 Minutes Intravenous Every 24 hours 10/05/21 2048 10/09/21 0807       Medications: Scheduled Meds:  docusate sodium  100 mg Oral BID   metoCLOPramide (REGLAN) injection  5 mg Intravenous Q6H   ondansetron (ZOFRAN) IV  4 mg Intravenous Q6H   potassium chloride  20 mEq Oral TID   prenatal multivitamin  1 tablet Oral Q1200   Continuous Infusions:  iron sucrose     PRN Meds:.acetaminophen (TYLENOL) oral liquid 160 mg/5 mL, acetaminophen, benzonatate, calcium carbonate, oxyCODONE, zolpidem    Objective: Weight change:   Intake/Output Summary (Last 24 hours) at 10/12/2021 1351 Last data filed at 10/12/2021 1219 Gross per 24 hour  Intake 100 ml  Output 2150 ml  Net -2050 ml    Blood pressure (!) 92/48, pulse (!) 116, temperature 100 F (37.8 C), temperature source Axillary, resp. rate 19, height 5\' 3"  (1.6 m), weight 80.8 kg, last menstrual period 01/15/2021, SpO2 97 %. Temp:  [98.2 F (36.8 C)-103 F (39.4 C)] 100 F (37.8 C) (01/08 1214) Pulse Rate:  [90-146] 116 (01/08 1230) Resp:  [16-19] 19 (01/08 1214) BP: (92-156)/(48-141) 92/48 (01/08 1230) SpO2:  [96 %-100 %] 97 % (01/08 1215)  Physical Exam: Physical Exam Constitutional:      General: She is not in acute distress.    Appearance: She is well-developed. She is obese. She  is not diaphoretic.  HENT:     Head: Normocephalic and atraumatic.     Right Ear: External ear normal.     Left Ear: External ear normal.     Mouth/Throat:     Lips: Pink.     Mouth: Mucous membranes are moist.     Dentition: Normal dentition. No dental tenderness, dental caries or gum lesions.     Tongue: No lesions. Tongue does not deviate from midline.     Pharynx: Oropharynx is clear. Uvula midline. No oropharyngeal exudate or uvula swelling.  Eyes:     General: No scleral icterus.    Conjunctiva/sclera: Conjunctivae normal.     Pupils: Pupils are equal, round, and reactive to light.  Cardiovascular:     Rate and Rhythm: Normal rate and regular rhythm.     Heart sounds: Normal heart sounds.  Pulmonary:     Effort: Pulmonary effort is normal. No respiratory distress.     Breath sounds: No wheezing or rales.  Abdominal:     Palpations: Abdomen is soft.  Musculoskeletal:        General: No tenderness. Normal range of motion.  Lymphadenopathy:     Cervical: No cervical adenopathy.  Skin:    General: Skin is warm and dry.  Coloration: Skin is not pale.     Findings: No erythema or rash.  Neurological:     General: No focal deficit present.     Mental Status: She is alert and oriented to person, place, and time.     Motor: No abnormal muscle tone.     Coordination: Coordination normal.  Psychiatric:        Mood and Affect: Mood normal.        Behavior: Behavior normal.        Thought Content: Thought content normal.        Judgment: Judgment normal.     CBC:    BMET Recent Labs    10/12/21 0445  NA 134*  K 2.9*  CL 100  CO2 24  GLUCOSE 80  BUN <5*  CREATININE 0.95  CALCIUM 7.6*      Liver Panel  Recent Labs    10/12/21 0445  PROT 5.1*  ALBUMIN 1.7*  AST 108*  ALT 34  ALKPHOS 95  BILITOT 0.9        Sedimentation Rate No results for input(s): ESRSEDRATE in the last 72 hours. C-Reactive Protein No results for input(s): CRP in the last  72 hours.  Micro Results: Recent Results (from the past 720 hour(s))  Culture, beta strep (group b only)     Status: Abnormal   Collection Time: 09/24/21  4:03 PM   Specimen: Vaginal/Rectal; Genital   VR  Result Value Ref Range Status   Strep Gp B Culture Positive (A) Negative Final    Comment: Centers for Disease Control and Prevention (CDC) and American Congress of Obstetricians and Gynecologists (ACOG) guidelines for prevention of perinatal group B streptococcal (GBS) disease specify co-collection of a vaginal and rectal swab specimen to maximize sensitivity of GBS detection. Per the CDC and ACOG, swabbing both the lower vagina and rectum substantially increases the yield of detection compared with sampling the vagina alone. Penicillin G, ampicillin, or cefazolin are indicated for intrapartum prophylaxis of perinatal GBS colonization. Reflex susceptibility testing should be performed prior to use of clindamycin only on GBS isolates from penicillin-allergic women who are considered a high risk for anaphylaxis. Treatment with vancomycin without additional testing is warranted if resistance to clindamycin is noted.   Resp Panel by RT-PCR (Flu A&B, Covid) Nasopharyngeal Swab     Status: None   Collection Time: 10/04/21  7:35 PM   Specimen: Nasopharyngeal Swab; Nasopharyngeal(NP) swabs in vial transport medium  Result Value Ref Range Status   SARS Coronavirus 2 by RT PCR NEGATIVE NEGATIVE Final    Comment: (NOTE) SARS-CoV-2 target nucleic acids are NOT DETECTED.  The SARS-CoV-2 RNA is generally detectable in upper respiratory specimens during the acute phase of infection. The lowest concentration of SARS-CoV-2 viral copies this assay can detect is 138 copies/mL. A negative result does not preclude SARS-Cov-2 infection and should not be used as the sole basis for treatment or other patient management decisions. A negative result may occur with  improper specimen  collection/handling, submission of specimen other than nasopharyngeal swab, presence of viral mutation(s) within the areas targeted by this assay, and inadequate number of viral copies(<138 copies/mL). A negative result must be combined with clinical observations, patient history, and epidemiological information. The expected result is Negative.  Fact Sheet for Patients:  EntrepreneurPulse.com.au  Fact Sheet for Healthcare Providers:  IncredibleEmployment.be  This test is no t yet approved or cleared by the [place] FDA and  has been authorized for detection and/or diagnosis of  SARS-CoV-2 by FDA under an Emergency Use Authorization (EUA). This EUA will remain  in effect (meaning this test can be used) for the duration of the COVID-19 declaration under Section 564(b)(1) of the Act, 21 U.S.C.section 360bbb-3(b)(1), unless the authorization is terminated  or revoked sooner.       Influenza A by PCR NEGATIVE NEGATIVE Final   Influenza B by PCR NEGATIVE NEGATIVE Final    Comment: (NOTE) The Xpert Xpress SARS-CoV-2/FLU/RSV plus assay is intended as an aid in the diagnosis of influenza from Nasopharyngeal swab specimens and should not be used as a sole basis for treatment. Nasal washings and aspirates are unacceptable for Xpert Xpress SARS-CoV-2/FLU/RSV testing.  Fact Sheet for Patients: EntrepreneurPulse.com.au  Fact Sheet for Healthcare Providers: IncredibleEmployment.be  This test is not yet approved or cleared by the [place] FDA and has been authorized for detection and/or diagnosis of SARS-CoV-2 by FDA under an Emergency Use Authorization (EUA). This EUA will remain in effect (meaning this test can be used) for the duration of the COVID-19 declaration under Section 564(b)(1) of the Act, 21 U.S.C. section 360bbb-3(b)(1), unless the authorization is terminated or revoked.  Performed at [facility], [place] [address], [place], [place] [address]   OB Urine Culture     Status: Abnormal   Collection Time: 10/05/21  8:41 PM   Specimen: OB Clean Catch; Urine  Result Value Ref Range Status   Specimen Description OB CLEAN CATCH  Final   Special Requests NONE  Final   Culture (A)  Final    >=100,000 COLONIES/mL ESCHERICHIA COLI NO GROUP B STREP (S.AGALACTIAE) ISOLATED Performed at [facility], [place] [address], [place], [place] [address]    Report Status 10/08/2021 FINAL  Final   Organism ID, Bacteria ESCHERICHIA COLI (A)  Final      Susceptibility   Escherichia coli - MIC*    AMPICILLIN <=2 SENSITIVE Sensitive     CEFAZOLIN <=4 SENSITIVE Sensitive     CEFEPIME <=0.12 SENSITIVE Sensitive     CEFTAZIDIME <=1 SENSITIVE Sensitive     CEFTRIAXONE <=0.25 SENSITIVE Sensitive     CIPROFLOXACIN <=0.25 SENSITIVE Sensitive     GENTAMICIN <=1 SENSITIVE Sensitive     IMIPENEM <=0.25 SENSITIVE Sensitive     TRIMETH/SULFA <=20 SENSITIVE Sensitive     AMPICILLIN/SULBACTAM <=2 SENSITIVE Sensitive     PIP/TAZO <=4 SENSITIVE Sensitive     * >=100,000 COLONIES/mL ESCHERICHIA COLI  Urine Culture     Status: None   Collection Time: 10/10/21 12:14 PM   Specimen: Urine, Clean Catch  Result Value Ref Range Status   Specimen Description URINE, CLEAN CATCH  Final   Special Requests NONE  Final   Culture   Final    NO GROWTH Performed at [facility], [place] [address], [place], [place] [address]    Report Status 10/12/2021 FINAL  Final  Respiratory (~20 pathogens) panel by PCR     Status: None   Collection Time: 10/11/21  9:46 AM   Specimen: Nasopharyngeal Swab; Respiratory  Result Value Ref Range Status   Adenovirus NOT DETECTED NOT DETECTED Final   Coronavirus 229E NOT DETECTED NOT DETECTED Final    Comment: (NOTE) The Coronavirus on the Respiratory Panel, DOES NOT test for the novel  Coronavirus (2019 nCoV)    Coronavirus HKU1 NOT DETECTED NOT DETECTED Final   Coronavirus  NL63 NOT DETECTED NOT DETECTED Final   Coronavirus OC43 NOT DETECTED NOT DETECTED Final   Metapneumovirus NOT  DETECTED NOT DETECTED Final   Rhinovirus / Enterovirus NOT DETECTED NOT DETECTED Final   Influenza A NOT DETECTED NOT DETECTED Final   Influenza B NOT DETECTED NOT DETECTED Final   Parainfluenza Virus 1 NOT DETECTED NOT DETECTED Final   Parainfluenza Virus 2 NOT DETECTED NOT DETECTED Final   Parainfluenza Virus 3 NOT DETECTED NOT DETECTED Final   Parainfluenza Virus 4 NOT DETECTED NOT DETECTED Final   Respiratory Syncytial Virus NOT DETECTED NOT DETECTED Final   Bordetella pertussis NOT DETECTED NOT DETECTED Final   Bordetella Parapertussis NOT DETECTED NOT DETECTED Final   Chlamydophila pneumoniae NOT DETECTED NOT DETECTED Final   Mycoplasma pneumoniae NOT DETECTED NOT DETECTED Final    Comment: Performed at [facility], [place] [address] [place], [place] [address]    Studies/Results: MR ABDOMEN WO CONTRAST  Result Date: 10/11/2021 CLINICAL DATA:  Pyelonephritis. EXAM: MRI ABDOMEN WITHOUT CONTRAST TECHNIQUE: Multiplanar multisequence MR imaging was performed without the administration of intravenous contrast. COMPARISON:  Renal sonogram 10/10/2021 FINDINGS: Lower chest: Small bilateral pleural effusions. Interlobular septal thickening identified concerning for mild interstitial edema. Hepatobiliary: Small cystic lesion within the lateral right hepatic lobe measures 6 mm, image 17/8. Partially collapsed gallbladder is unremarkable. No signs of bile duct dilatation. Pancreas: No mass, inflammatory changes, or other parenchymal abnormality identified. Spleen:  Within normal limits in size and appearance. Adrenals/Urinary Tract:  The adrenal glands are normal. Mild bilateral hydronephrosis identified. On the diffusion-weighted sequences there are multiple peripheral segmental cortical areas of increased signal within the right kidney compatible with pyelonephritis, image 99/10, image  105/10 and image 109/10. This is compatible with pyelonephritis. Single segmental area of peripheral increased signal on the diffusion-weighted sequence noted within the posterior cortex of the interpolar left kidney. Also compatible with pyelonephritis. No signs of renal abscess identified bilaterally. Stomach/Bowel: No dilated bowel loops identified. Vascular/Lymphatic: Visualized portions within the abdomen are unremarkable. Other: Diffuse body wall edema is identified. Trace perihepatic ascites. No fluid collections identified. Gravid uterus is identified consistent with [redacted] week gestation Musculoskeletal: No suspicious bone lesions identified. IMPRESSION: 1. Imaging findings compatible with bilateral pyelonephritis, right greater than left. No signs of renal abscess. 2. Mild bilateral hydronephrosis. 3. Gravid uterus consistent with [redacted] week gestation. 4. Small pleural effusions and mild interstitial pulmonary edema. There is also diffuse body wall edema. Electronically Signed   By: Kerby Moors M.D.   On: 10/11/2021 11:51      Assessment/Plan:  INTERVAL HISTORY: Fevers continue  Principal Problem:   Pyelonephritis affecting pregnancy Active Problems:   [redacted] weeks gestation of pregnancy   Back pain affecting pregnancy in third trimester   Pyelonephritis   Urinary tract infection affecting care of mother in third trimester, antepartum   FUO (fever of unknown origin)   Pleural effusion due to another disorder    Dawn Small is a 26 y.o. female pregnant at [redacted] weeks gestation admitted with fevers chills nausea dysuria and evidence of right-sided pyelonephritis.  She has had persistent fevers despite being on antibiotics that have all been active against her fairly pansensitive E. Coli  Work-up for deep infection with renal ultrasound and MRI of the abdomen has been completely unrevealing.    #1 FUO:  I still have not been able to identify a clear cause for her fevers.  There should  certainly not due to a urinary tract infection at this point in time.  I cannot find evidence of a deep infection.  Her respiratory viral panel was sent  but was negative.  I will initiate additional FUO labs today.I did order vascular Dopplers of her upper and lower extremities to look for deep venous thrombosis as part of an FUO work-up yesterday  I will stop her antibiotics because we do not have a clear target for them and there is the possibility though not very typical that her fever could be a drug fever  Dr Damita Dunnings has told me that if fevers persist she would want to proceed with deliver at 39 weeks which is in a few days  #2 UTI: this has been treated with almost 8 days of antibiotics--will dc them  #2 Pleural effusions: These are small and bilateral and likely transudative.   I spent 36 minutes with the patient including than 50% of the time in face to face counseling of the patient and her boyfriend regarding her FUO work-up personally reviewing MRI of the abdomen updated culture serologies along with review of medical records in preparation for the visit and during the visit and in coordination of her care.    LOS: 7 days   Alcide Evener 10/12/2021, 1:51 PM

## 2021-10-13 ENCOUNTER — Inpatient Hospital Stay (HOSPITAL_BASED_OUTPATIENT_CLINIC_OR_DEPARTMENT_OTHER): Payer: Medicaid Other

## 2021-10-13 DIAGNOSIS — N12 Tubulo-interstitial nephritis, not specified as acute or chronic: Secondary | ICD-10-CM | POA: Diagnosis not present

## 2021-10-13 DIAGNOSIS — Z362 Encounter for other antenatal screening follow-up: Secondary | ICD-10-CM

## 2021-10-13 DIAGNOSIS — O2343 Unspecified infection of urinary tract in pregnancy, third trimester: Secondary | ICD-10-CM | POA: Diagnosis not present

## 2021-10-13 DIAGNOSIS — R509 Fever, unspecified: Secondary | ICD-10-CM | POA: Diagnosis not present

## 2021-10-13 DIAGNOSIS — Z3A37 37 weeks gestation of pregnancy: Secondary | ICD-10-CM | POA: Diagnosis not present

## 2021-10-13 DIAGNOSIS — Z3A38 38 weeks gestation of pregnancy: Secondary | ICD-10-CM | POA: Diagnosis not present

## 2021-10-13 DIAGNOSIS — O99891 Other specified diseases and conditions complicating pregnancy: Secondary | ICD-10-CM | POA: Diagnosis not present

## 2021-10-13 DIAGNOSIS — D509 Iron deficiency anemia, unspecified: Secondary | ICD-10-CM | POA: Diagnosis not present

## 2021-10-13 DIAGNOSIS — J90 Pleural effusion, not elsewhere classified: Secondary | ICD-10-CM | POA: Diagnosis not present

## 2021-10-13 DIAGNOSIS — O99013 Anemia complicating pregnancy, third trimester: Secondary | ICD-10-CM | POA: Diagnosis not present

## 2021-10-13 DIAGNOSIS — O2303 Infections of kidney in pregnancy, third trimester: Secondary | ICD-10-CM | POA: Diagnosis not present

## 2021-10-13 DIAGNOSIS — M549 Dorsalgia, unspecified: Secondary | ICD-10-CM | POA: Diagnosis not present

## 2021-10-13 LAB — CBC WITH DIFFERENTIAL/PLATELET
Abs Immature Granulocytes: 0.5 10*3/uL — ABNORMAL HIGH (ref 0.00–0.07)
Band Neutrophils: 15 %
Basophils Absolute: 0 10*3/uL (ref 0.0–0.1)
Basophils Relative: 0 %
Eosinophils Absolute: 0 10*3/uL (ref 0.0–0.5)
Eosinophils Relative: 0 %
HCT: 29.5 % — ABNORMAL LOW (ref 36.0–46.0)
Hemoglobin: 8.3 g/dL — ABNORMAL LOW (ref 12.0–15.0)
Lymphocytes Relative: 18 %
Lymphs Abs: 1.6 10*3/uL (ref 0.7–4.0)
MCH: 18.5 pg — ABNORMAL LOW (ref 26.0–34.0)
MCHC: 28.1 g/dL — ABNORMAL LOW (ref 30.0–36.0)
MCV: 65.7 fL — ABNORMAL LOW (ref 80.0–100.0)
Metamyelocytes Relative: 3 %
Monocytes Absolute: 0.2 10*3/uL (ref 0.1–1.0)
Monocytes Relative: 2 %
Myelocytes: 1 %
Neutro Abs: 6.8 10*3/uL (ref 1.7–7.7)
Neutrophils Relative %: 60 %
Platelets: 222 10*3/uL (ref 150–400)
Promyelocytes Relative: 1 %
RBC: 4.49 MIL/uL (ref 3.87–5.11)
RDW: 31.4 % — ABNORMAL HIGH (ref 11.5–15.5)
Smear Review: NORMAL
WBC: 9.1 10*3/uL (ref 4.0–10.5)
nRBC: 0.8 % — ABNORMAL HIGH (ref 0.0–0.2)

## 2021-10-13 LAB — BASIC METABOLIC PANEL
Anion gap: 10 (ref 5–15)
BUN: <5 mg/dL — ABNORMAL LOW (ref 6–20)
CO2: 24 mmol/L (ref 22–32)
Calcium: 7.7 mg/dL — ABNORMAL LOW (ref 8.9–10.3)
Chloride: 100 mmol/L (ref 98–111)
Creatinine, Ser: 0.92 mg/dL (ref 0.44–1.00)
GFR, Estimated: >60 mL/min (ref >60)
Glucose, Bld: 75 mg/dL (ref 70–99)
Potassium: 3.7 mmol/L (ref 3.5–5.1)
Sodium: 134 mmol/L — ABNORMAL LOW (ref 135–145)

## 2021-10-13 LAB — RESP PANEL BY RT-PCR (FLU A&B, COVID) ARPGX2
Influenza A by PCR: NEGATIVE
Influenza B by PCR: NEGATIVE
SARS Coronavirus 2 by RT PCR: NEGATIVE

## 2021-10-13 LAB — LACTATE DEHYDROGENASE: LDH: 356 U/L — ABNORMAL HIGH (ref 98–192)

## 2021-10-13 LAB — CK: Total CK: 106 U/L (ref 38–234)

## 2021-10-13 LAB — HEPATITIS PANEL, ACUTE
HCV Ab: NONREACTIVE
Hep A IgM: NONREACTIVE
Hep B C IgM: NONREACTIVE
Hepatitis B Surface Ag: NONREACTIVE

## 2021-10-13 LAB — RPR: RPR Ser Ql: NONREACTIVE

## 2021-10-13 LAB — FERRITIN: Ferritin: 1649 ng/mL — ABNORMAL HIGH (ref 11–307)

## 2021-10-13 MED ORDER — COMPLETENATE 29-1 MG PO CHEW
1.0000 | CHEWABLE_TABLET | Freq: Every day | ORAL | Status: DC
  Filled 2021-10-13: qty 1

## 2021-10-13 NOTE — Progress Notes (Signed)
Initial Nutrition Assessment  DOCUMENTATION CODES:   Not applicable  INTERVENTION:  Regular Diet Pt may order double protein portions and snacks TID if she makes request when ordering meals  C/L diet if nausea worsens   NUTRITION DIAGNOSIS:  Increased nutrient needs related to  (pregnancy and fetal growth requirements) as evidenced by  (38 weeks IUP).  GOAL:   Patient will meet greater than or equal to 90% of their needs   MONITOR:   Weight trends  REASON FOR ASSESSMENT:  Antenatal, LOS    ASSESSMENT:   Now 38 5/7 weeks,admitted for pyelonephritis, Fever , chronic anemia. Wt at 15 weeks 73.8 Kg, BMI 28.8. 15 lb weight gain to date. Intermitent nausea.  receives PNV w/ iron plus venofer IV.    NUTRITION - FOCUSED PHYSICAL EXAM:  Diet Order:   Diet Order             Diet regular Room service appropriate? Yes; Fluid consistency: Thin  Diet effective now                   EDUCATION NEEDS:   No education needs have been identified at this time  Skin:  Skin Assessment: Reviewed RN Assessment Height:   Ht Readings from Last 1 Encounters:  10/05/21 5\' 3"  (1.6 m)   Weight:   Wt Readings from Last 1 Encounters:  10/05/21 80.8 kg   Ideal Body Weight:   115 lbs  BMI:  Body mass index is 31.55 kg/m.  Estimated Nutritional Needs:   Kcal:  1800-2000  Protein:  80-90 g  Fluid:  2.1 L

## 2021-10-13 NOTE — Progress Notes (Signed)
Patient ID: Dawn Small, female   DOB: [birth year], 26 y.o.   MRN: [id number] FACULTY PRACTICE ANTEPARTUM(COMPREHENSIVE) NOTE  Dawn Small is a 26 y.o. [id number] at [redacted]w[redacted]d by  who is admitted for pyelonephritis .  Off antibiotic and still has fevers Fetal presentation is cephalic. Length of Stay:  8  Days  Subjective: Feels better no nausea, back pain Patient reports the fetal movement as active. Patient reports uterine contraction  activity as occasional. Patient reports  vaginal bleeding as none. Patient describes fluid per vagina as None.  Vitals:  Blood pressure 129/89, pulse (!) 127, temperature 100.3 F (37.9 C), temperature source Oral, resp. rate 18, height 5\' 3"  (1.6 m), weight 80.8 kg, last menstrual period 01/15/2021, SpO2 99 %. Physical Examination:  General appearance - alert, well appearing, and in no distress Heart - normal rate and regular rhythm Abdomen - soft, nontender, nondistended Fundal Height:  size equals dates Cervical Exam: Not evaluated. . Extremities: extremities normal, atraumatic, no cyanosis or edema and Homans sign is negative, no sign of DVT Membranes:intact  Fetal Monitoring:   Fetal Heart Rate A   Mode External  [removed] filed at 10/12/2021 1311  Baseline Rate (A) 145 bpm filed at 10/12/2021 1311  Variability 6-25 BPM filed at 10/12/2021 1311  Accelerations 15 x 15 filed at 10/12/2021 1311  Decelerations Variable filed at 10/12/2021 1311    Labs:  Results for orders placed or performed during the hospital encounter of 10/05/21 (from the past 24 hour(s))  Lactate dehydrogenase   Collection Time: 10/12/21  1:30 PM  Result Value Ref Range   LDH 299 (H) 98 - 192 U/L  CBC with Differential/Platelet   Collection Time: 10/12/21 11:57 PM  Result Value Ref Range   WBC 9.1 4.0 - 10.5 K/uL   RBC 4.49 3.87 - 5.11 MIL/uL   Hemoglobin 8.3 (L) 12.0 - 15.0 g/dL   HCT 12/10/21 (L) 52.7 - 78.2 %   MCV 65.7 (L) 80.0 - 100.0 fL   MCH 18.5 (L) 26.0 - 34.0 pg    MCHC 28.1 (L) 30.0 - 36.0 g/dL   RDW 42.3 (H) 53.6 - 14.4 %   Platelets 222 150 - 400 K/uL   nRBC 0.8 (H) 0.0 - 0.2 %   Neutrophils Relative % 60 %   Neutro Abs 6.8 1.7 - 7.7 K/uL   Band Neutrophils 15 %   Lymphocytes Relative 18 %   Lymphs Abs 1.6 0.7 - 4.0 K/uL   Monocytes Relative 2 %   Monocytes Absolute 0.2 0.1 - 1.0 K/uL   Eosinophils Relative 0 %   Eosinophils Absolute 0.0 0.0 - 0.5 K/uL   Basophils Relative 0 %   Basophils Absolute 0.0 0.0 - 0.1 K/uL   WBC Morphology MILD LEFT SHIFT (1-5% METAS, OCC MYELO, OCC BANDS)    RBC Morphology See Note    Smear Review Normal platelet morphology    Metamyelocytes Relative 3 %   Myelocytes 1 %   Promyelocytes Relative 1 %   Abs Immature Granulocytes 0.50 (H) 0.00 - 0.07 K/uL   Polychromasia PRESENT   Basic metabolic panel   Collection Time: 10/12/21 11:57 PM  Result Value Ref Range   Sodium 134 (L) 135 - 145 mmol/L   Potassium 3.7 3.5 - 5.1 mmol/L   Chloride 100 98 - 111 mmol/L   CO2 24 22 - 32 mmol/L   Glucose, Bld 75 70 - 99 mg/dL   BUN <5 (L) 6 - 20 mg/dL   Creatinine, Ser  0.92 0.44 - 1.00 mg/dL   Calcium 7.7 (L) 8.9 - 10.3 mg/dL   GFR, Estimated >63 >87 mL/min   Anion gap 10 5 - 15  CK   Collection Time: 10/12/21 11:57 PM  Result Value Ref Range   Total CK 106 38 - 234 U/L  Lactate dehydrogenase   Collection Time: 10/12/21 11:57 PM  Result Value Ref Range   LDH 356 (H) 98 - 192 U/L  Ferritin   Collection Time: 10/12/21 11:57 PM  Result Value Ref Range   Ferritin 1,649 (H) 11 - 307 ng/mL  RPR   Collection Time: 10/12/21 11:57 PM  Result Value Ref Range   RPR Ser Ql NON REACTIVE NON REACTIVE  Hepatitis panel, acute   Collection Time: 10/13/21 12:00 AM  Result Value Ref Range   Hepatitis B Surface Ag NON REACTIVE NON REACTIVE   HCV Ab NON REACTIVE NON REACTIVE   Hep A IgM NON REACTIVE NON REACTIVE   Hep B C IgM NON REACTIVE NON REACTIVE     Medications:  Scheduled  docusate sodium  100 mg Oral BID    metoCLOPramide (REGLAN) injection  5 mg Intravenous Q6H   ondansetron (ZOFRAN) IV  4 mg Intravenous Q6H   [START ON 10/14/2021] prenatal vitamin w/FE, FA  1 tablet Oral Q1200   I have reviewed the patient's current medications.  ASSESSMENT: Patient Active Problem List   Diagnosis Date Noted   Pleural effusion due to another disorder 10/11/2021   [redacted] weeks gestation of pregnancy    Back pain affecting pregnancy in third trimester    Pyelonephritis    Urinary tract infection affecting care of mother in third trimester, antepartum    FUO (fever of unknown origin)    Antepartum fetal tachycardia affecting care of mother 10/05/2021   Pyelonephritis affecting pregnancy 10/05/2021   Group B Streptococcus carrier, +RV culture, currently pregnant 09/30/2021   Iron deficiency anemia of mother during pregnancy 07/29/2021   Alpha thalassemia trait 05/30/2021   Supervision of normal pregnancy, antepartum 04/23/2021    PLAN: S/p extremity dopplers. Pending covid test. F/u fetal US and consult MFM re delivery plan  Scheryl Darter 10/13/2021,12:00 PM

## 2021-10-13 NOTE — Progress Notes (Signed)
Subjective: No new complaints   Antibiotics:  Anti-infectives (From admission, onward)    Start     Dose/Rate Route Frequency Ordered Stop   10/10/21 1400  ceFAZolin (ANCEF) IVPB 1 g/50 mL premix  Status:  Discontinued        1 g 100 mL/hr over 30 Minutes Intravenous Every 8 hours 10/10/21 1024 10/12/21 1320   10/09/21 0900  ceFEPIme (MAXIPIME) 2 g in sodium chloride 0.9 % 100 mL IVPB  Status:  Discontinued        2 g 200 mL/hr over 30 Minutes Intravenous Every 8 hours 10/09/21 0807 10/10/21 1024   10/05/21 2100  cefTRIAXone (ROCEPHIN) 2 g in sodium chloride 0.9 % 100 mL IVPB  Status:  Discontinued        2 g 200 mL/hr over 30 Minutes Intravenous Every 24 hours 10/05/21 2048 10/09/21 0807       Medications: Scheduled Meds:  docusate sodium  100 mg Oral BID   metoCLOPramide (REGLAN) injection  5 mg Intravenous Q6H   ondansetron (ZOFRAN) IV  4 mg Intravenous Q6H   [START ON 10/14/2021] prenatal vitamin w/FE, FA  1 tablet Oral Q1200   Continuous Infusions:   PRN Meds:.acetaminophen (TYLENOL) oral liquid 160 mg/5 mL, acetaminophen, benzonatate, calcium carbonate, oxyCODONE, zolpidem    Objective: Weight change:   Intake/Output Summary (Last 24 hours) at 10/13/2021 1224 Last data filed at 10/12/2021 1823 Gross per 24 hour  Intake 275.03 ml  Output --  Net 275.03 ml    Blood pressure 129/89, pulse (!) 127, temperature 100.3 F (37.9 C), temperature source Oral, resp. rate 18, height 5\' 3"  (1.6 m), weight 80.8 kg, last menstrual period 01/15/2021, SpO2 99 %. Temp:  [97.7 F (36.5 C)-102.6 F (39.2 C)] 100.3 F (37.9 C) (01/09 1100) Pulse Rate:  [92-127] 127 (01/09 1100) Resp:  [16-18] 18 (01/09 1100) BP: (92-129)/(45-89) 129/89 (01/09 1100) SpO2:  [97 %-100 %] 99 % (01/09 1100)  Physical Exam: Physical Exam Constitutional:      General: She is not in acute distress.    Appearance: She is well-developed. She is not diaphoretic.  HENT:     Head:  Normocephalic and atraumatic.     Right Ear: External ear normal.     Left Ear: External ear normal.     Mouth/Throat:     Pharynx: No oropharyngeal exudate.  Eyes:     General: No scleral icterus.    Extraocular Movements: Extraocular movements intact.     Conjunctiva/sclera: Conjunctivae normal.     Pupils: Pupils are equal, round, and reactive to light.  Cardiovascular:     Rate and Rhythm: Normal rate and regular rhythm.  Pulmonary:     Effort: Pulmonary effort is normal. No respiratory distress.     Breath sounds: Normal breath sounds. No wheezing or rales.  Abdominal:     General: There is no distension.  Musculoskeletal:        General: No tenderness. Normal range of motion.  Lymphadenopathy:     Cervical: No cervical adenopathy.  Skin:    General: Skin is warm and dry.     Coloration: Skin is not pale.     Findings: No erythema or rash.  Neurological:     General: No focal deficit present.     Mental Status: She is alert and oriented to person, place, and time.     Motor: No abnormal muscle tone.     Coordination: Coordination normal.  Psychiatric:        Mood and Affect: Mood normal.        Behavior: Behavior normal.        Thought Content: Thought content normal.        Judgment: Judgment normal.     CBC:    BMET Recent Labs    10/12/21 0445 10/12/21 2357  NA 134* 134*  K 2.9* 3.7  CL 100 100  CO2 24 24  GLUCOSE 80 75  BUN <5* <5*  CREATININE 0.95 0.92  CALCIUM 7.6* 7.7*      Liver Panel  Recent Labs    10/12/21 0445  PROT 5.1*  ALBUMIN 1.7*  AST 108*  ALT 34  ALKPHOS 95  BILITOT 0.9        Sedimentation Rate No results for input(s): ESRSEDRATE in the last 72 hours. C-Reactive Protein No results for input(s): CRP in the last 72 hours.  Micro Results: Recent Results (from the past 720 hour(s))  Culture, beta strep (group b only)     Status: Abnormal   Collection Time: 09/24/21  4:03 PM   Specimen: Vaginal/Rectal; Genital    VR  Result Value Ref Range Status   Strep Gp B Culture Positive (A) Negative Final    Comment: Centers for Disease Control and Prevention (CDC) and American Congress of Obstetricians and Gynecologists (ACOG) guidelines for prevention of perinatal group B streptococcal (GBS) disease specify co-collection of a vaginal and rectal swab specimen to maximize sensitivity of GBS detection. Per the CDC and ACOG, swabbing both the lower vagina and rectum substantially increases the yield of detection compared with sampling the vagina alone. Penicillin G, ampicillin, or cefazolin are indicated for intrapartum prophylaxis of perinatal GBS colonization. Reflex susceptibility testing should be performed prior to use of clindamycin only on GBS isolates from penicillin-allergic women who are considered a high risk for anaphylaxis. Treatment with vancomycin without additional testing is warranted if resistance to clindamycin is noted.   Resp Panel by RT-PCR (Flu A&B, Covid) Nasopharyngeal Swab     Status: None   Collection Time: 10/04/21  7:35 PM   Specimen: Nasopharyngeal Swab; Nasopharyngeal(NP) swabs in vial transport medium  Result Value Ref Range Status   SARS Coronavirus 2 by RT PCR NEGATIVE NEGATIVE Final    Comment: (NOTE) SARS-CoV-2 target nucleic acids are NOT DETECTED.  The SARS-CoV-2 RNA is generally detectable in upper respiratory specimens during the acute phase of infection. The lowest concentration of SARS-CoV-2 viral copies this assay can detect is 138 copies/mL. A negative result does not preclude SARS-Cov-2 infection and should not be used as the sole basis for treatment or other patient management decisions. A negative result may occur with  improper specimen collection/handling, submission of specimen other than nasopharyngeal swab, presence of viral mutation(s) within the areas targeted by this assay, and inadequate number of viral copies(<138 copies/mL). A negative result must  be combined with clinical observations, patient history, and epidemiological information. The expected result is Negative.  Fact Sheet for Patients:  EntrepreneurPulse.com.au  Fact Sheet for Healthcare Providers:  IncredibleEmployment.be  This test is no t yet approved or cleared by the [place] FDA and  has been authorized for detection and/or diagnosis of SARS-CoV-2 by FDA under an Emergency Use Authorization (EUA). This EUA will remain  in effect (meaning this test can be used) for the duration of the COVID-19 declaration under Section 564(b)(1) of the Act, 21 U.S.C.section 360bbb-3(b)(1), unless the authorization is terminated  or revoked sooner.  Influenza A by PCR NEGATIVE NEGATIVE Final   Influenza B by PCR NEGATIVE NEGATIVE Final    Comment: (NOTE) The Xpert Xpress SARS-CoV-2/FLU/RSV plus assay is intended as an aid in the diagnosis of influenza from Nasopharyngeal swab specimens and should not be used as a sole basis for treatment. Nasal washings and aspirates are unacceptable for Xpert Xpress SARS-CoV-2/FLU/RSV testing.  Fact Sheet for Patients: EntrepreneurPulse.com.au  Fact Sheet for Healthcare Providers: IncredibleEmployment.be  This test is not yet approved or cleared by the [place] FDA and has been authorized for detection and/or diagnosis of SARS-CoV-2 by FDA under an Emergency Use Authorization (EUA). This EUA will remain in effect (meaning this test can be used) for the duration of the COVID-19 declaration under Section 564(b)(1) of the Act, 21 U.S.C. section 360bbb-3(b)(1), unless the authorization is terminated or revoked.  Performed at [facility], [place] [address] [place], [place] [address]   OB Urine Culture     Status: Abnormal   Collection Time: 10/05/21  8:41 PM   Specimen: OB Clean Catch; Urine  Result Value Ref Range Status   Specimen Description OB  CLEAN CATCH  Final   Special Requests NONE  Final   Culture (A)  Final    >=100,000 COLONIES/mL ESCHERICHIA COLI NO GROUP B STREP (S.AGALACTIAE) ISOLATED Performed at [facility], [place] [address] [place], [place] [address]    Report Status 10/08/2021 FINAL  Final   Organism ID, Bacteria ESCHERICHIA COLI (A)  Final      Susceptibility   Escherichia coli - MIC*    AMPICILLIN <=2 SENSITIVE Sensitive     CEFAZOLIN <=4 SENSITIVE Sensitive     CEFEPIME <=0.12 SENSITIVE Sensitive     CEFTAZIDIME <=1 SENSITIVE Sensitive     CEFTRIAXONE <=0.25 SENSITIVE Sensitive     CIPROFLOXACIN <=0.25 SENSITIVE Sensitive     GENTAMICIN <=1 SENSITIVE Sensitive     IMIPENEM <=0.25 SENSITIVE Sensitive     TRIMETH/SULFA <=20 SENSITIVE Sensitive     AMPICILLIN/SULBACTAM <=2 SENSITIVE Sensitive     PIP/TAZO <=4 SENSITIVE Sensitive     * >=100,000 COLONIES/mL ESCHERICHIA COLI  Urine Culture     Status: None   Collection Time: 10/10/21 12:14 PM   Specimen: Urine, Clean Catch  Result Value Ref Range Status   Specimen Description URINE, CLEAN CATCH  Final   Special Requests NONE  Final   Culture   Final    NO GROWTH Performed at [facility], [place] [address], [place], [place] [address]    Report Status 10/12/2021 FINAL  Final  Respiratory (~20 pathogens) panel by PCR     Status: None   Collection Time: 10/11/21  9:46 AM   Specimen: Nasopharyngeal Swab; Respiratory  Result Value Ref Range Status   Adenovirus NOT DETECTED NOT DETECTED Final   Coronavirus 229E NOT DETECTED NOT DETECTED Final    Comment: (NOTE) The Coronavirus on the Respiratory Panel, DOES NOT test for the novel  Coronavirus (2019 nCoV)    Coronavirus HKU1 NOT DETECTED NOT DETECTED Final   Coronavirus NL63 NOT DETECTED NOT DETECTED Final   Coronavirus OC43 NOT DETECTED NOT DETECTED Final   Metapneumovirus NOT DETECTED NOT DETECTED Final   Rhinovirus / Enterovirus NOT DETECTED NOT DETECTED Final   Influenza A NOT DETECTED NOT  DETECTED Final   Influenza B NOT DETECTED NOT DETECTED Final   Parainfluenza Virus 1 NOT DETECTED NOT DETECTED Final   Parainfluenza Virus 2 NOT DETECTED NOT DETECTED Final  Parainfluenza Virus 3 NOT DETECTED NOT DETECTED Final   Parainfluenza Virus 4 NOT DETECTED NOT DETECTED Final   Respiratory Syncytial Virus NOT DETECTED NOT DETECTED Final   Bordetella pertussis NOT DETECTED NOT DETECTED Final   Bordetella Parapertussis NOT DETECTED NOT DETECTED Final   Chlamydophila pneumoniae NOT DETECTED NOT DETECTED Final   Mycoplasma pneumoniae NOT DETECTED NOT DETECTED Final    Comment: Performed at [facility], [place] [address], [place], [place] [address]    Studies/Results: VAS [place] LOWER EXTREMITY VENOUS (DVT)  Result Date: 10/12/2021  Lower Venous DVT Study Patient Name:  NEOSHA SHIMEL  Date of Exam:   10/12/2021 Medical Rec #: [id number]       Accession #:    [id number] Date of Birth: [birth year]       Patient Gender: F Patient Age:   45 years Exam Location:  [facility] Procedure:      VAS [place] LOWER EXTREMITY VENOUS (DVT) Referring Phys: Shiann Kam VAN DAM --------------------------------------------------------------------------------  Indications: Fever of unknown origin. [redacted] weeks pregnant.  Limitations: Body habitus and Significant lower extremity edema. Pregnancy. Comparison Study: No prior study Performing Technologist: Sharion Dove RVS  Examination Guidelines: A complete evaluation includes B-mode imaging, spectral Doppler, color Doppler, and power Doppler as needed of all accessible portions of each vessel. Bilateral testing is considered an integral part of a complete examination. Limited examinations for reoccurring indications may be performed as noted. The reflux portion of the exam is performed with the patient in reverse Trendelenburg.  +---------+---------------+---------+-----------+----------+--------------+  RIGHT     Compressibility Phasicity Spontaneity Properties Thrombus  Aging  +---------+---------------+---------+-----------+----------+--------------+  CFV       Full            Yes       Yes                                    +---------+---------------+---------+-----------+----------+--------------+  SFJ       Full                                                             +---------+---------------+---------+-----------+----------+--------------+  FV Prox   Full                                                             +---------+---------------+---------+-----------+----------+--------------+  FV Mid    Full                                                             +---------+---------------+---------+-----------+----------+--------------+  FV Distal Full                                                             +---------+---------------+---------+-----------+----------+--------------+  PFV       Full                                                             +---------+---------------+---------+-----------+----------+--------------+  POP       Full            Yes       Yes                                    +---------+---------------+---------+-----------+----------+--------------+  PTV       Full                                                             +---------+---------------+---------+-----------+----------+--------------+  PERO      Full                                                             +---------+---------------+---------+-----------+----------+--------------+   +---------+---------------+---------+-----------+----------+-------------------+  LEFT      Compressibility Phasicity Spontaneity Properties Thrombus Aging       +---------+---------------+---------+-----------+----------+-------------------+  CFV       Full            Yes       No                                          +---------+---------------+---------+-----------+----------+-------------------+  SFJ       Full                                                                   +---------+---------------+---------+-----------+----------+-------------------+  FV Prox   Full                                                                  +---------+---------------+---------+-----------+----------+-------------------+  FV Mid                                                     Not well visualized  +---------+---------------+---------+-----------+----------+-------------------+  FV Distal                 Yes       Yes  patent by color and                                                              Doppler              +---------+---------------+---------+-----------+----------+-------------------+  PFV                       Yes       Yes                    patent by color and                                                              Doppler              +---------+---------------+---------+-----------+----------+-------------------+  POP       Full            Yes       Yes                                         +---------+---------------+---------+-----------+----------+-------------------+  PTV       Full                                                                  +---------+---------------+---------+-----------+----------+-------------------+  PERO      Full                                                                  +---------+---------------+---------+-----------+----------+-------------------+     Summary: BILATERAL: -No evidence of popliteal cyst, bilaterally. RIGHT: - There is no evidence of deep vein thrombosis in the lower extremity. However, portions of this examination were limited- see technologist comments above.  LEFT: - There is no evidence of deep vein thrombosis in the lower extremity. However, portions of this examination were limited- see technologist comments above.  - Ultrasound characteristics of enlarged lymph nodes noted in the groin.  *See table(s) above for measurements and observations.    Preliminary    VAS [place] UPPER EXTREMITY  VENOUS DUPLEX  Result Date: 10/12/2021 UPPER VENOUS STUDY  Patient Name:  Somaya Georgiou  Date of Exam:   10/12/2021 Medical Rec #: [id number]       Accession #:    [id number] Date of Birth: [birth year]       Patient Gender: F Patient Age:   25 years Exam Location:  [facility] Procedure:      VAS [place] UPPER EXTREMITY VENOUS DUPLEX Referring Phys: --------------------------------------------------------------------------------  Indications: Fever of unknown origin, [redacted] weeks pregnant Limitations: Line. Comparison Study: No prior study Performing Technologist: Sharion Dove RVS  Examination Guidelines: A complete evaluation includes B-mode imaging, spectral Doppler, color Doppler, and power Doppler as needed of all accessible portions of each vessel. Bilateral testing is considered an integral part of a complete examination. Limited examinations for reoccurring indications may be performed as noted.  Right Findings: +----------+------------+---------+-----------+----------+--------------------+  RIGHT      Compressible Phasicity Spontaneous Properties       Summary         +----------+------------+---------+-----------+----------+--------------------+  IJV            Full        Yes        Yes                                      +----------+------------+---------+-----------+----------+--------------------+  Subclavian                 Yes        Yes                                      +----------+------------+---------+-----------+----------+--------------------+  Axillary                   Yes        Yes                                      +----------+------------+---------+-----------+----------+--------------------+  Brachial       Full                                                            +----------+------------+---------+-----------+----------+--------------------+  Radial         Full                                                             +----------+------------+---------+-----------+----------+--------------------+  Ulnar                                                      patent by color     +----------+------------+---------+-----------+----------+--------------------+  Cephalic       Full                                                            +----------+------------+---------+-----------+----------+--------------------+  Basilic        None  Small segment of                                                                   acute SVT        +----------+------------+---------+-----------+----------+--------------------+  Left Findings: +----------+------------+---------+-----------+----------+--------------+  LEFT       Compressible Phasicity Spontaneous Properties    Summary      +----------+------------+---------+-----------+----------+--------------+  IJV            Full        Yes        Yes                                +----------+------------+---------+-----------+----------+--------------+  Subclavian                 Yes        Yes                                +----------+------------+---------+-----------+----------+--------------+  Axillary                   Yes        Yes                                +----------+------------+---------+-----------+----------+--------------+  Brachial       Full                                                      +----------+------------+---------+-----------+----------+--------------+  Radial         Full                                                      +----------+------------+---------+-----------+----------+--------------+  Ulnar                                                    Not visualized  +----------+------------+---------+-----------+----------+--------------+  Cephalic       Full                                                      +----------+------------+---------+-----------+----------+--------------+  Basilic        Full                                                       +----------+------------+---------+-----------+----------+--------------+  Summary:  Right: No evidence of deep vein thrombosis in the upper extremity. Findings consistent with acute superficial vein thrombosis involving the right basilic vein.  Left: No evidence of deep vein thrombosis in the upper extremity. No evidence of superficial vein thrombosis in the upper extremity.  *See table(s) above for measurements and observations.    Preliminary       Assessment/Plan:  INTERVAL HISTORY: still febrile  Principal Problem:   Pyelonephritis affecting pregnancy Active Problems:   [redacted] weeks gestation of pregnancy   Back pain affecting pregnancy in third trimester   Pyelonephritis   Urinary tract infection affecting care of mother in third trimester, antepartum   FUO (fever of unknown origin)   Pleural effusion due to another disorder    Rozella Dutrow is a 26 y.o. female pregnant at [redacted] weeks gestation admitted with fevers chills nausea dysuria and evidence of right-sided pyelonephritis.  She has had persistent fevers despite being on antibiotics that have all been active against her fairly pansensitive E. Coli  Work-up for deep infection with renal ultrasound and MRI of the abdomen has been completely unrevealing.    #1 FUO:  I still have not been able to identify a clear cause for her fevers.  There should certainly not due to a urinary tract infection at this point in time.  I cannot find evidence of a deep infection.  Her respiratory viral panel was sent but was negative. HOWEVER I failed to realize that COVID 19 was not included on this panel so I am repeating a COVID-19 PCR  I will initiate initiated further FUO labs yesterday.  I stopped her IV antibiotics as there was no clear-cut target for them     Dr Damita Dunnings has told me that if fevers persist she would want to proceed with deliver at 39 weeks which is in a few days  #2 UTI: this has  been treated with almost 8 days of antibiotics--will dc them  #2 Pleural effusions: These are small and bilateral and likely transudative.   I spent 36 minutes with the patient including than 50% of the time in face to face counseling of the patient guarding her FUO work-up personally reviewing Dopplers updated serologies cultures along with review of medical records in preparation for the visit and during the visit and in coordination of her care.     LOS: 8 days   Alcide Evener 10/13/2021, 12:24 PM

## 2021-10-14 ENCOUNTER — Inpatient Hospital Stay (HOSPITAL_COMMUNITY): Payer: Medicaid Other | Admitting: Anesthesiology

## 2021-10-14 ENCOUNTER — Encounter (HOSPITAL_COMMUNITY): Payer: Self-pay | Admitting: Obstetrics and Gynecology

## 2021-10-14 DIAGNOSIS — O9882 Other maternal infectious and parasitic diseases complicating childbirth: Secondary | ICD-10-CM | POA: Diagnosis not present

## 2021-10-14 DIAGNOSIS — R509 Fever, unspecified: Secondary | ICD-10-CM | POA: Diagnosis not present

## 2021-10-14 DIAGNOSIS — O904 Postpartum acute kidney failure: Secondary | ICD-10-CM | POA: Diagnosis not present

## 2021-10-14 DIAGNOSIS — N179 Acute kidney failure, unspecified: Secondary | ICD-10-CM | POA: Diagnosis not present

## 2021-10-14 DIAGNOSIS — Z20822 Contact with and (suspected) exposure to covid-19: Secondary | ICD-10-CM | POA: Diagnosis not present

## 2021-10-14 DIAGNOSIS — D62 Acute posthemorrhagic anemia: Secondary | ICD-10-CM | POA: Diagnosis not present

## 2021-10-14 DIAGNOSIS — Z3A37 37 weeks gestation of pregnancy: Secondary | ICD-10-CM | POA: Diagnosis not present

## 2021-10-14 DIAGNOSIS — Z3A39 39 weeks gestation of pregnancy: Secondary | ICD-10-CM | POA: Diagnosis not present

## 2021-10-14 DIAGNOSIS — D509 Iron deficiency anemia, unspecified: Secondary | ICD-10-CM | POA: Diagnosis not present

## 2021-10-14 DIAGNOSIS — O411230 Chorioamnionitis, third trimester, not applicable or unspecified: Secondary | ICD-10-CM

## 2021-10-14 DIAGNOSIS — O2303 Infections of kidney in pregnancy, third trimester: Secondary | ICD-10-CM | POA: Diagnosis not present

## 2021-10-14 DIAGNOSIS — N12 Tubulo-interstitial nephritis, not specified as acute or chronic: Secondary | ICD-10-CM | POA: Diagnosis not present

## 2021-10-14 DIAGNOSIS — J90 Pleural effusion, not elsewhere classified: Secondary | ICD-10-CM | POA: Diagnosis not present

## 2021-10-14 LAB — RHEUMATOID FACTOR: Rheumatoid fact SerPl-aCnc: 13 IU/mL (ref <14.0)

## 2021-10-14 LAB — COMPREHENSIVE METABOLIC PANEL
ALT: 83 U/L — ABNORMAL HIGH (ref 0–44)
AST: 379 U/L — ABNORMAL HIGH (ref 15–41)
Albumin: 1.8 g/dL — ABNORMAL LOW (ref 3.5–5.0)
Alkaline Phosphatase: 114 U/L (ref 38–126)
Anion gap: 9 (ref 5–15)
BUN: <5 mg/dL — ABNORMAL LOW (ref 6–20)
CO2: 24 mmol/L (ref 22–32)
Calcium: 7 mg/dL — ABNORMAL LOW (ref 8.9–10.3)
Chloride: 101 mmol/L (ref 98–111)
Creatinine, Ser: 1.01 mg/dL — ABNORMAL HIGH (ref 0.44–1.00)
GFR, Estimated: >60 mL/min (ref >60)
Glucose, Bld: 81 mg/dL (ref 70–99)
Potassium: 3.3 mmol/L — ABNORMAL LOW (ref 3.5–5.1)
Sodium: 134 mmol/L — ABNORMAL LOW (ref 135–145)
Total Bilirubin: 0.9 mg/dL (ref 0.3–1.2)
Total Protein: 5.1 g/dL — ABNORMAL LOW (ref 6.5–8.1)

## 2021-10-14 LAB — HIV-1 RNA QUANT-NO REFLEX-BLD
HIV 1 RNA Quant: <20 copies/mL
LOG10 HIV-1 RNA: UNDETERMINED log10copy/mL

## 2021-10-14 LAB — CBC WITH DIFFERENTIAL/PLATELET
Abs Immature Granulocytes: 0.4 10*3/uL — ABNORMAL HIGH (ref 0.00–0.07)
Basophils Absolute: 0.1 10*3/uL (ref 0.0–0.1)
Basophils Relative: 1 %
Eosinophils Absolute: 0 10*3/uL (ref 0.0–0.5)
Eosinophils Relative: 0 %
HCT: 26.5 % — ABNORMAL LOW (ref 36.0–46.0)
Hemoglobin: 7.6 g/dL — ABNORMAL LOW (ref 12.0–15.0)
Lymphocytes Relative: 13 %
Lymphs Abs: 1.2 10*3/uL (ref 0.7–4.0)
MCH: 18.8 pg — ABNORMAL LOW (ref 26.0–34.0)
MCHC: 28.7 g/dL — ABNORMAL LOW (ref 30.0–36.0)
MCV: 65.4 fL — ABNORMAL LOW (ref 80.0–100.0)
Metamyelocytes Relative: 3 %
Monocytes Absolute: 0.2 10*3/uL (ref 0.1–1.0)
Monocytes Relative: 2 %
Myelocytes: 2 %
Neutro Abs: 7 10*3/uL (ref 1.7–7.7)
Neutrophils Relative %: 79 %
Platelets: 247 10*3/uL (ref 150–400)
RBC: 4.05 MIL/uL (ref 3.87–5.11)
RDW: 31.2 % — ABNORMAL HIGH (ref 11.5–15.5)
WBC: 8.9 10*3/uL (ref 4.0–10.5)
nRBC: 1 /100 WBC — ABNORMAL HIGH
nRBC: 1.8 % — ABNORMAL HIGH (ref 0.0–0.2)

## 2021-10-14 LAB — ANGIOTENSIN CONVERTING ENZYME: Angiotensin-Converting Enzyme: 55 U/L (ref 14–82)

## 2021-10-14 LAB — ANTINUCLEAR ANTIBODIES, IFA: ANA Ab, IFA: NEGATIVE

## 2021-10-14 MED ORDER — OXYCODONE-ACETAMINOPHEN 5-325 MG PO TABS: 1.0000 | ORAL_TABLET | ORAL | Status: DC | PRN

## 2021-10-14 MED ORDER — POTASSIUM CHLORIDE 10 MEQ/100ML IV SOLN
10.0000 meq | INTRAVENOUS | Status: DC
  Administered 2021-10-14 (×2): 10 meq via INTRAVENOUS
  Filled 2021-10-14 (×4): qty 100

## 2021-10-14 MED ORDER — MISOPROSTOL 50MCG HALF TABLET
50.0000 ug | ORAL_TABLET | ORAL | Status: DC
  Administered 2021-10-14 (×2): 50 ug via BUCCAL
  Filled 2021-10-14 (×2): qty 1

## 2021-10-14 MED ORDER — PHENYLEPHRINE 40 MCG/ML (10ML) SYRINGE FOR IV PUSH (FOR BLOOD PRESSURE SUPPORT): 80.0000 ug | PREFILLED_SYRINGE | INTRAVENOUS | Status: DC | PRN

## 2021-10-14 MED ORDER — ACETAMINOPHEN 325 MG PO TABS
650.0000 mg | ORAL_TABLET | ORAL | Status: DC | PRN
  Filled 2021-10-14: qty 2

## 2021-10-14 MED ORDER — LIDOCAINE HCL (PF) 1 % IJ SOLN
INTRAMUSCULAR | Status: DC | PRN
  Administered 2021-10-14 (×2): 5 mL via EPIDURAL

## 2021-10-14 MED ORDER — SOD CITRATE-CITRIC ACID 500-334 MG/5ML PO SOLN
30.0000 mL | ORAL | Status: DC | PRN
  Administered 2021-10-15: 30 mL via ORAL
  Filled 2021-10-14: qty 30

## 2021-10-14 MED ORDER — LACTATED RINGERS IV SOLN: INTRAVENOUS | Status: DC

## 2021-10-14 MED ORDER — POTASSIUM CHLORIDE 20 MEQ PO PACK
20.0000 meq | PACK | Freq: Once | ORAL | Status: AC
  Administered 2021-10-14: 20 meq via ORAL
  Filled 2021-10-14: qty 1

## 2021-10-14 MED ORDER — LACTATED RINGERS IV SOLN
500.0000 mL | INTRAVENOUS | Status: DC | PRN
  Administered 2021-10-14: 250 mL via INTRAVENOUS
  Administered 2021-10-15: 1000 mL via INTRAVENOUS
  Administered 2021-10-15: 500 mL via INTRAVENOUS

## 2021-10-14 MED ORDER — FENTANYL CITRATE (PF) 100 MCG/2ML IJ SOLN
50.0000 ug | INTRAMUSCULAR | Status: DC | PRN
  Administered 2021-10-14: 50 ug via INTRAVENOUS
  Administered 2021-10-14: 100 ug via INTRAVENOUS
  Administered 2021-10-14: 50 ug via INTRAVENOUS
  Filled 2021-10-14 (×2): qty 2

## 2021-10-14 MED ORDER — SODIUM CHLORIDE 0.9 % IV SOLN
1.0000 g | INTRAVENOUS | Status: DC
  Administered 2021-10-14 – 2021-10-15 (×4): 1 g via INTRAVENOUS
  Filled 2021-10-14 (×4): qty 1000

## 2021-10-14 MED ORDER — EPHEDRINE 5 MG/ML INJ: 10.0000 mg | INTRAVENOUS | Status: DC | PRN

## 2021-10-14 MED ORDER — ACETAMINOPHEN 160 MG/5ML PO SOLN
650.0000 mg | Freq: Four times a day (QID) | ORAL | Status: DC | PRN
  Administered 2021-10-14 – 2021-10-15 (×4): 650 mg via ORAL
  Filled 2021-10-14 (×5): qty 20.3

## 2021-10-14 MED ORDER — OXYTOCIN-SODIUM CHLORIDE 30-0.9 UT/500ML-% IV SOLN
2.5000 [IU]/h | INTRAVENOUS | Status: DC
  Filled 2021-10-14: qty 500

## 2021-10-14 MED ORDER — SODIUM CHLORIDE 0.9 % IV SOLN
2.0000 g | Freq: Once | INTRAVENOUS | Status: AC
  Administered 2021-10-14: 2 g via INTRAVENOUS
  Filled 2021-10-14: qty 2000

## 2021-10-14 MED ORDER — GENTAMICIN SULFATE 40 MG/ML IJ SOLN
5.0000 mg/kg | INTRAVENOUS | Status: DC
  Administered 2021-10-14: 320 mg via INTRAVENOUS
  Filled 2021-10-14 (×2): qty 8

## 2021-10-14 MED ORDER — LIDOCAINE HCL (PF) 1 % IJ SOLN: 30.0000 mL | INTRAMUSCULAR | Status: DC | PRN

## 2021-10-14 MED ORDER — OXYCODONE-ACETAMINOPHEN 5-325 MG PO TABS: 2.0000 | ORAL_TABLET | ORAL | Status: DC | PRN

## 2021-10-14 MED ORDER — DIPHENHYDRAMINE HCL 50 MG/ML IJ SOLN: 12.5000 mg | INTRAMUSCULAR | Status: DC | PRN

## 2021-10-14 MED ORDER — ONDANSETRON HCL 4 MG/2ML IJ SOLN: 4.0000 mg | Freq: Four times a day (QID) | INTRAMUSCULAR | Status: DC | PRN

## 2021-10-14 MED ORDER — LACTATED RINGERS IV SOLN: 500.0000 mL | Freq: Once | INTRAVENOUS | Status: DC

## 2021-10-14 MED ORDER — FENTANYL-BUPIVACAINE-NACL 0.5-0.125-0.9 MG/250ML-% EP SOLN
12.0000 mL/h | EPIDURAL | Status: DC | PRN
  Administered 2021-10-14: 12 mL/h via EPIDURAL
  Filled 2021-10-14: qty 250

## 2021-10-14 MED ORDER — OXYTOCIN BOLUS FROM INFUSION: 333.0000 mL | Freq: Once | INTRAVENOUS | Status: DC

## 2021-10-14 NOTE — Consult Note (Signed)
MFM Consultation  DOS: 10/14/21 Requesting provider: Scheryl Darter, MD Reason for request: Delivery planning persistent fever   Dawn Small is a 26 yo G5P0 who is at 38w 6d who is HD 9 for  treatment of right sided pyelonephritis with now persistent fever.  Dawn Small presented on 1/1 with fever and tenderness. She had a positive culture for E.Coli. she was treated with IV antibiotics. An ID consult was performed with most recent recommendation to d/c antibiotics as the she was treated appropriately for pansensitive E.Coli. The evaluation was neg for Respiratory causes. MRI and Renal ultrasound did not reveal abscess or additional cause for persistent fever. She does have a small bilateral effusions.   Vitals:   10/13/21 2323 10/14/21 0325 10/14/21 0435 10/14/21 0803  BP: (!) 104/55 118/66  134/83  Pulse: (!) 116 (!) 114  (!) 107  Temp: 99.4 F (37.4 C) (!) 102.8 F (39.3 C) 98.2 F (36.8 C) Comment: recheck 100.1 F (37.8 C)  Resp: 16 18  18   Height:      Weight:      SpO2: 96%   100%  TempSrc: Oral Oral Oral Oral  BMI (Calculated):       Physical exam revealed mild global tenderness with touch and mild movement.    NST has been reactive with mild fetal tachycardia.   Ultrasound performed on 10/13/21 7 lb 10oz 3448 g. Cephalic presentation.   Impression/Counseling:  Pregnancy with possible fever of unknown origin now suggesting an intratmniotic infection with new on set abdominal tenderness with persistent fever.  Dawn Small reports that she does not feel well and that her abdominal pain this morning was new. As I had not examined her before this was not previously reported in prior examination.  Given this I recommend moving toward delivery for suspected chorioamnionitis. Antibiotics per protocol.  I discussed the plan of care with Dr. Chilton Si and Dawn Small.  Both are in agreement.  I spent 45 minutes with > 50 in direct counseling, record review and coordination of care with  providers.  All questions answered.  Debroah Loop, MD.

## 2021-10-14 NOTE — Progress Notes (Signed)
Labor Progress Note Dawn Small is a 26 y.o. [id number] at [redacted]w[redacted]d presented for term TOL in the setting of persistent fevers with bilateral pyelo and new onset presumed chorio.   S: Feeling contractions/tightening with the cytotec.   O:  BP 101/64    Pulse 100    Temp (!) 96.8 F (36 C) (Axillary)    Resp 18    Ht 5\' 3"  (1.6 m)    Wt 80.8 kg    LMP 01/15/2021 (Approximate)    SpO2 96%    BMI 31.55 kg/m  EFM: 160/mod/none/none  CVE: Dilation: 1.5 Effacement (%): 60 Station: -3 Presentation: Vertex (by ultrasound) Exam by:: [email or web address]   A&P: 26 y.o. 30 [redacted]w[redacted]d  #Labor: Some progression since cytotec about 4.5-5 hours ago--has been contracting consistently about every 2 minutes with this, FHT has been tolerating well with exception of transient fetal tachycardia associated with fever as below. After verbal consent, placed a FB. Tolerated well. Will hold off additional cytotec for now as she continues to contract regularly. #Pain: PRN #FWB: Cat I currently, intermittently II w/ fever  #GBS positive  #Persistent fever, unclear origin   B/L Pyelonephritis   Presumed Triple I: Last febrile 101F today at 1618. S/p 8 days of abx, continuing on amp/gent started today. Monitor fever closely. ID on board and appreciate assistance.   [redacted]w[redacted]d, DO 8:08 PM

## 2021-10-14 NOTE — Discharge Summary (Signed)
° °   See discharge summary completed 10/17/21

## 2021-10-14 NOTE — Progress Notes (Signed)
Labor Progress Note   Patient just arrived to L&D from antepartum at recommendation of MFM for delivery at term due to persistent fevers in the setting of bilateral pyelonephritis (w/o abscess) and now presumed intra-amniotic infection with new onset uterine tenderness.   She is s/p 8 days of antibiotics, now starting amp/gent for presumed Triple I and GBS prophylaxis.   Pregnancy course: Anemia of pregnancy + Iron deficiency: Hgb 7.6 today. S/p IV venofer.  Alpha thalassemia carrier   Dilation: Fingertip Effacement (%): Thick Station: Ballotable Presentation: Vertex (by ultrasound) Exam by:: Darletta Moll   A/p:  Labor: Start with Buccal cytotec. Plan for FB when able.  Triple I/GBS: Amp and Gent started  Pain: PRN  Circ: yes, inpatient  MOF: Formula, may also consider breast  Contraception: Unsure, would like to discuss options later.   #Hypokalemia: K 3.3 today. Give IV potassium x4.   #Persistent fever, unclear origin   B/L Pyelonephritis   Presumed Triple I: Last febrile 102.42F at 1/10 0325. S/p 8 days of abx, start amp/gent. Monitor fever closely. ID on board and appreciate assistance.   Allayne Stack, DO

## 2021-10-14 NOTE — Anesthesia Preprocedure Evaluation (Addendum)
Anesthesia Evaluation  Patient identified by MRN, date of birth, ID band Patient awake    Reviewed: Allergy & Precautions, Patient's Chart, lab work & pertinent test results  Airway Mallampati: II       Dental no notable dental hx.    Pulmonary asthma ,    Pulmonary exam normal        Cardiovascular negative cardio ROS Normal cardiovascular exam     Neuro/Psych negative neurological ROS  negative psych ROS   GI/Hepatic GERD  ,Elevated LFTs   Endo/Other  Obesity  Renal/GU Pyelonephritis  negative genitourinary   Musculoskeletal negative musculoskeletal ROS (+)   Abdominal (+) + obese,   Peds  Hematology  (+) anemia ,   Anesthesia Other Findings   Reproductive/Obstetrics (+) Pregnancy                           Anesthesia Physical Anesthesia Plan  ASA: 2  Anesthesia Plan: Epidural   Post-op Pain Management:    Induction:   PONV Risk Score and Plan:   Airway Management Planned: Natural Airway  Additional Equipment:   Intra-op Plan:   Post-operative Plan:   Informed Consent: I have reviewed the patients History and Physical, chart, labs and discussed the procedure including the risks, benefits and alternatives for the proposed anesthesia with the patient or authorized representative who has indicated his/her understanding and acceptance.       Plan Discussed with: Anesthesiologist  Anesthesia Plan Comments:         Anesthesia Quick Evaluation

## 2021-10-14 NOTE — Progress Notes (Addendum)
[facility] Infectious Diseases Follow Up Note  Patient Identification: Patient Name: Dawn Small MRN: [id number] Admit Date: 10/05/2021  7:14 PM Age: 26 y.o.Today's Date: 10/14/2021   Reason for Visit: fevers   Principal Problem:   Pyelonephritis affecting pregnancy Active Problems:   [redacted] weeks gestation of pregnancy   Back pain affecting pregnancy in third trimester   Pyelonephritis   Urinary tract infection affecting care of mother in third trimester, antepartum   FUO (fever of unknown origin)   Pleural effusion due to another disorder   Antibiotics: Ceftriaxone 1/1-1/4                       Cefepime 1/5                    Cefazolin 1/6-1/7  Lines/Hardwares: PIV  Interval Events: continues to be febrile,Tmax 103.1. No leukocytosis   Assessment Persistent fevers - new onset abdominal tenderness which per patient *2-3 days. Concern for chonrioamnionitis. Also has loose stools E Coli complicated UTI/bilateral pyelopnephritis - has received 7 days of treatment only  Diarrhea - for last few days, 3-4 episodes, watery, no blood. No abdominal cramps. Possibly antibiotic related Vaginal Group B streptococcus positive - intrapartum antibiotic prophylaxis per OB Pregnancy - planned for delivery    Recommendations 2 sets of blood cultures ( ordered ) Start Ampicillin and gentamicin for concerns of intra-amnionitic infection Monitor loose stools  Follow plans for delivery   Rest of the management as per the primary team. Thank you for the consult. Please page with pertinent questions or concerns.  ______________________________________________________________________ Subjective patient seen and examined at the bedside.  Febrile Abdominal tenderness for last 2-3 days Loose stools since last few days, 4-5 times, watery, no blood   Vitals BP 134/83 (BP Location: Right Arm)    Pulse (!) 107    Temp 100.1 F (37.8 C) (Oral)     Resp 18    Ht 5\' 3"  (1.6 m)    Wt 80.8 kg    LMP 01/15/2021 (Approximate)    SpO2 100%    BMI 31.55 kg/m     Physical Exam Constitutional:  sitting up in the chair    Comments:   Cardiovascular:     Rate and Rhythm: Normal rate and regular rhythm.     Heart sounds:   Pulmonary:     Effort: Pulmonary effort is normal.     Comments:   Abdominal:     Palpations: gravid abdomen    Tenderness: mild generalized tenderness   Musculoskeletal:        General: swelling of bilateral lower extremities   Skin:    Comments: No signs of phlebitis   Neurological:     General: No focal deficit present.   Psychiatric:        Mood and Affect: Mood normal. Calm and cooperative    Pertinent Microbiology Results for orders placed or performed during the hospital encounter of 10/05/21  OB Urine Culture     Status: Abnormal   Collection Time: 10/05/21  8:41 PM   Specimen: OB Clean Catch; Urine  Result Value Ref Range Status   Specimen Description OB CLEAN CATCH  Final   Special Requests NONE  Final   Culture (A)  Final    >=100,000 COLONIES/mL ESCHERICHIA COLI NO GROUP B STREP (S.AGALACTIAE) ISOLATED Performed at [facility], [address] [place], [place] [address]    Report Status 10/08/2021 FINAL  Final  Organism ID, Bacteria ESCHERICHIA COLI (A)  Final      Susceptibility   Escherichia coli - MIC*    AMPICILLIN <=2 SENSITIVE Sensitive     CEFAZOLIN <=4 SENSITIVE Sensitive     CEFEPIME <=0.12 SENSITIVE Sensitive     CEFTAZIDIME <=1 SENSITIVE Sensitive     CEFTRIAXONE <=0.25 SENSITIVE Sensitive     CIPROFLOXACIN <=0.25 SENSITIVE Sensitive     GENTAMICIN <=1 SENSITIVE Sensitive     IMIPENEM <=0.25 SENSITIVE Sensitive     TRIMETH/SULFA <=20 SENSITIVE Sensitive     AMPICILLIN/SULBACTAM <=2 SENSITIVE Sensitive     PIP/TAZO <=4 SENSITIVE Sensitive     * >=100,000 COLONIES/mL ESCHERICHIA COLI  Urine Culture     Status: None   Collection Time: 10/10/21 12:14 PM    Specimen: Urine, Clean Catch  Result Value Ref Range Status   Specimen Description URINE, CLEAN CATCH  Final   Special Requests NONE  Final   Culture   Final    NO GROWTH Performed at [facility], [place] [address], [place], [place] [address]    Report Status 10/12/2021 FINAL  Final  Respiratory (~20 pathogens) panel by PCR     Status: None   Collection Time: 10/11/21  9:46 AM   Specimen: Nasopharyngeal Swab; Respiratory  Result Value Ref Range Status   Adenovirus NOT DETECTED NOT DETECTED Final   Coronavirus 229E NOT DETECTED NOT DETECTED Final    Comment: (NOTE) The Coronavirus on the Respiratory Panel, DOES NOT test for the novel  Coronavirus (2019 nCoV)    Coronavirus HKU1 NOT DETECTED NOT DETECTED Final   Coronavirus NL63 NOT DETECTED NOT DETECTED Final   Coronavirus OC43 NOT DETECTED NOT DETECTED Final   Metapneumovirus NOT DETECTED NOT DETECTED Final   Rhinovirus / Enterovirus NOT DETECTED NOT DETECTED Final   Influenza A NOT DETECTED NOT DETECTED Final   Influenza B NOT DETECTED NOT DETECTED Final   Parainfluenza Virus 1 NOT DETECTED NOT DETECTED Final   Parainfluenza Virus 2 NOT DETECTED NOT DETECTED Final   Parainfluenza Virus 3 NOT DETECTED NOT DETECTED Final   Parainfluenza Virus 4 NOT DETECTED NOT DETECTED Final   Respiratory Syncytial Virus NOT DETECTED NOT DETECTED Final   Bordetella pertussis NOT DETECTED NOT DETECTED Final   Bordetella Parapertussis NOT DETECTED NOT DETECTED Final   Chlamydophila pneumoniae NOT DETECTED NOT DETECTED Final   Mycoplasma pneumoniae NOT DETECTED NOT DETECTED Final    Comment: Performed at [facility], [place]. [address] [place], [place] [address]  Resp Panel by RT-PCR (Flu A&B, Covid) Nasopharyngeal Swab     Status: None   Collection Time: 10/13/21 10:55 AM   Specimen: Nasopharyngeal Swab; Nasopharyngeal(NP) swabs in vial transport medium  Result Value Ref Range Status   SARS Coronavirus 2 by RT PCR NEGATIVE NEGATIVE Final     Comment: (NOTE) SARS-CoV-2 target nucleic acids are NOT DETECTED.  The SARS-CoV-2 RNA is generally detectable in upper respiratory specimens during the acute phase of infection. The lowest concentration of SARS-CoV-2 viral copies this assay can detect is 138 copies/mL. A negative result does not preclude SARS-Cov-2 infection and should not be used as the sole basis for treatment or other patient management decisions. A negative result may occur with  improper specimen collection/handling, submission of specimen other than nasopharyngeal swab, presence of viral mutation(s) within the areas targeted by this assay, and inadequate number of viral copies(<138 copies/mL). A negative result must be combined with clinical observations, patient history, and epidemiological information.  The expected result is Negative.  Fact Sheet for Patients:  EntrepreneurPulse.com.au  Fact Sheet for Healthcare Providers:  IncredibleEmployment.be  This test is no t yet approved or cleared by the [place] FDA and  has been authorized for detection and/or diagnosis of SARS-CoV-2 by FDA under an Emergency Use Authorization (EUA). This EUA will remain  in effect (meaning this test can be used) for the duration of the COVID-19 declaration under Section 564(b)(1) of the Act, 21 U.S.C.section 360bbb-3(b)(1), unless the authorization is terminated  or revoked sooner.       Influenza A by PCR NEGATIVE NEGATIVE Final   Influenza B by PCR NEGATIVE NEGATIVE Final    Comment: (NOTE) The Xpert Xpress SARS-CoV-2/FLU/RSV plus assay is intended as an aid in the diagnosis of influenza from Nasopharyngeal swab specimens and should not be used as a sole basis for treatment. Nasal washings and aspirates are unacceptable for Xpert Xpress SARS-CoV-2/FLU/RSV testing.  Fact Sheet for Patients: EntrepreneurPulse.com.au  Fact Sheet for Healthcare  Providers: IncredibleEmployment.be  This test is not yet approved or cleared by the [place] FDA and has been authorized for detection and/or diagnosis of SARS-CoV-2 by FDA under an Emergency Use Authorization (EUA). This EUA will remain in effect (meaning this test can be used) for the duration of the COVID-19 declaration under Section 564(b)(1) of the Act, 21 U.S.C. section 360bbb-3(b)(1), unless the authorization is terminated or revoked.  Performed at [facility], [place] [address], [place], [place] [address]     Pertinent Lab. CBC Latest Ref Rng & Units 10/14/2021 10/12/2021 10/12/2021  WBC 4.0 - 10.5 K/uL 8.9 9.1 9.5  Hemoglobin 12.0 - 15.0 g/dL 7.6(L) 8.3(L) 7.9(L)  Hematocrit 36.0 - 46.0 % 26.5(L) 29.5(L) 27.9(L)  Platelets 150 - 400 K/uL 247 222 196   CMP Latest Ref Rng & Units 10/12/2021 10/12/2021 10/04/2021  Glucose 70 - 99 mg/dL 75 80 92  BUN 6 - 20 mg/dL <5(L) <5(L) 6  Creatinine 0.44 - 1.00 mg/dL 0.92 0.95 0.76  Sodium 135 - 145 mmol/L 134(L) 134(L) 134(L)  Potassium 3.5 - 5.1 mmol/L 3.7 2.9(L) 3.8  Chloride 98 - 111 mmol/L 100 100 104  CO2 22 - 32 mmol/L 24 24 21(L)  Calcium 8.9 - 10.3 mg/dL 7.7(L) 7.6(L) 8.6(L)  Total Protein 6.5 - 8.1 g/dL - 5.1(L) 6.8  Total Bilirubin 0.3 - 1.2 mg/dL - 0.9 1.0  Alkaline Phos 38 - 126 U/L - 95 119  AST 15 - 41 U/L - 108(H) 19  ALT 0 - 44 U/L - 34 15    Pertinent Imaging today Plain films and CT images have been personally visualized and interpreted; radiology reports have been reviewed. Decision making incorporated into the Impression / Recommendations.  MR ABDOMEN WO CONTRAST  Result Date: 10/11/2021 CLINICAL DATA:  Pyelonephritis. EXAM: MRI ABDOMEN WITHOUT CONTRAST TECHNIQUE: Multiplanar multisequence MR imaging was performed without the administration of intravenous contrast. COMPARISON:  Renal sonogram 10/10/2021 FINDINGS: Lower chest: Small bilateral pleural effusions. Interlobular septal thickening  identified concerning for mild interstitial edema. Hepatobiliary: Small cystic lesion within the lateral right hepatic lobe measures 6 mm, image 17/8. Partially collapsed gallbladder is unremarkable. No signs of bile duct dilatation. Pancreas: No mass, inflammatory changes, or other parenchymal abnormality identified. Spleen:  Within normal limits in size and appearance. Adrenals/Urinary Tract:  The adrenal glands are normal. Mild bilateral hydronephrosis identified. On the diffusion-weighted sequences there are multiple peripheral segmental cortical areas of increased signal within the right kidney compatible with pyelonephritis,  image 99/10, image 105/10 and image 109/10. This is compatible with pyelonephritis. Single segmental area of peripheral increased signal on the diffusion-weighted sequence noted within the posterior cortex of the interpolar left kidney. Also compatible with pyelonephritis. No signs of renal abscess identified bilaterally. Stomach/Bowel: No dilated bowel loops identified. Vascular/Lymphatic: Visualized portions within the abdomen are unremarkable. Other: Diffuse body wall edema is identified. Trace perihepatic ascites. No fluid collections identified. Gravid uterus is identified consistent with [redacted] week gestation Musculoskeletal: No suspicious bone lesions identified. IMPRESSION: 1. Imaging findings compatible with bilateral pyelonephritis, right greater than left. No signs of renal abscess. 2. Mild bilateral hydronephrosis. 3. Gravid uterus consistent with [redacted] week gestation. 4. Small pleural effusions and mild interstitial pulmonary edema. There is also diffuse body wall edema. Electronically Signed   By: Kerby Moors M.D.   On: 10/11/2021 11:51   US RENAL  Result Date: 10/10/2021 CLINICAL DATA:  Pyelonephritis.  Third trimester pregnancy. EXAM: RENAL / URINARY TRACT ULTRASOUND COMPLETE COMPARISON:  Renal ultrasound 10/07/2021 FINDINGS: Right Kidney: Renal measurements: 11.7 x 6.1 x  3.4 cm = volume: 124 mL. Echogenicity within normal limits. No mass identified. There is mild-to-moderate right hydronephrosis, changed from prior. Left Kidney: Renal measurements: 11.1 x 5.9 x 3.0 cm = volume: 103 mL. Echogenicity within normal limits. No mass identified. Bladder: Appears normal for degree of bladder distention. Prevoid urinary bladder volume: 74 mL. Patient reported she did not have urge to urinate and no postvoid measurement was performed. Other: None. IMPRESSION: Bilateral mild-to-moderate hydronephrosis, mildly greater on the right. This is not significantly changed from 10/07/2021. This again is compatible with maternal hydronephrosis in pregnancy. Electronically Signed   By: Yvonne Kendall   On: 10/10/2021 14:01   US RENAL  Result Date: 10/07/2021 CLINICAL DATA:  Urinary tract infection. Third trimester pregnancy. Back pain. EXAM: RENAL / URINARY TRACT ULTRASOUND COMPLETE COMPARISON:  None. FINDINGS: Right Kidney: Renal measurements: 11.4 x 5.3 x 4.7 cm = volume: 148 mL. Echogenicity is within normal limits. Moderate hydrocephalus is present. No focal mass lesion or stone is present. Left Kidney: Renal measurements: 11.1 x 5.2 x 4.5 cm = volume: 135 mL. Echogenicity is within normal limits. Moderate hydrocephalus is present. No focal mass lesion or stone is present. Bladder: Appears normal for degree of bladder distention. Other: None. IMPRESSION: 1. Bilateral hydronephrosis, likely related to pregnancy. 2. No focal parenchymal abnormality or mass lesion. Electronically Signed   By: San Morelle M.D.   On: 10/07/2021 10:02   VAS [place] LOWER EXTREMITY VENOUS (DVT)  Result Date: 10/13/2021  Lower Venous DVT Study Patient Name:  Dawn Small  Date of Exam:   10/12/2021 Medical Rec #: [id number]       Accession #:    [id number] Date of Birth: 05/07/1996       Patient Gender: F Patient Age:   70 years Exam [facility]  [facility] Procedure:      VAS [place] LOWER EXTREMITY VENOUS (DVT)  Referring Phys: CORNELIUS VAN DAM --------------------------------------------------------------------------------  Indications: Fever of unknown origin. [redacted] weeks pregnant.  Limitations: Body habitus and Significant lower extremity edema. Pregnancy. Comparison Study: No prior study Performing Technologist: Sharion Dove RVS  Examination Guidelines: A complete evaluation includes B-mode imaging, spectral Doppler, color Doppler, and power Doppler as needed of all accessible portions of each vessel. Bilateral testing is considered an integral part of a complete examination. Limited examinations for reoccurring indications may be performed as noted. The reflux portion of the exam is performed with the  patient in reverse Trendelenburg.  +---------+---------------+---------+-----------+----------+--------------+  RIGHT     Compressibility Phasicity Spontaneity Properties Thrombus Aging  +---------+---------------+---------+-----------+----------+--------------+  CFV       Full            Yes       Yes                                    +---------+---------------+---------+-----------+----------+--------------+  SFJ       Full                                                             +---------+---------------+---------+-----------+----------+--------------+  FV Prox   Full                                                             +---------+---------------+---------+-----------+----------+--------------+  FV Mid    Full                                                             +---------+---------------+---------+-----------+----------+--------------+  FV Distal Full                                                             +---------+---------------+---------+-----------+----------+--------------+  PFV       Full                                                             +---------+---------------+---------+-----------+----------+--------------+  POP       Full            Yes       Yes                                     +---------+---------------+---------+-----------+----------+--------------+  PTV       Full                                                             +---------+---------------+---------+-----------+----------+--------------+  PERO      Full                                                             +---------+---------------+---------+-----------+----------+--------------+   +---------+---------------+---------+-----------+----------+-------------------+  LEFT      Compressibility Phasicity Spontaneity Properties Thrombus Aging       +---------+---------------+---------+-----------+----------+-------------------+  CFV       Full            Yes       No                                          +---------+---------------+---------+-----------+----------+-------------------+  SFJ       Full                                                                  +---------+---------------+---------+-----------+----------+-------------------+  FV Prox   Full                                                                  +---------+---------------+---------+-----------+----------+-------------------+  FV Mid                                                     Not well visualized  +---------+---------------+---------+-----------+----------+-------------------+  FV Distal                 Yes       Yes                    patent by color and                                                              Doppler              +---------+---------------+---------+-----------+----------+-------------------+  PFV                       Yes       Yes                    patent by color and                                                              Doppler              +---------+---------------+---------+-----------+----------+-------------------+  POP       Full            Yes       Yes                                         +---------+---------------+---------+-----------+----------+-------------------+  PTV       Full                                                                   +---------+---------------+---------+-----------+----------+-------------------+  PERO      Full                                                                  +---------+---------------+---------+-----------+----------+-------------------+     Summary: BILATERAL: -No evidence of popliteal cyst, bilaterally. RIGHT: - There is no evidence of deep vein thrombosis in the lower extremity. However, portions of this examination were limited- see technologist comments above.  LEFT: - There is no evidence of deep vein thrombosis in the lower extremity. However, portions of this examination were limited- see technologist comments above.  - Ultrasound characteristics of enlarged lymph nodes noted in the groin.  *See table(s) above for measurements and observations. Electronically signed by Servando Snare MD on 10/13/2021 at 3:13:06 PM.    Final    VAS [place] UPPER EXTREMITY VENOUS DUPLEX  Result Date: 10/13/2021 UPPER VENOUS STUDY  Patient Name:  Dawn Small  Date of Exam:   10/12/2021 Medical Rec #: [id number]       Accession #:    [id number] Date of Birth: July 27, 1996       Patient Gender: F Patient Age:   26 years Exam [facility]  [facility] Procedure:      VAS [place] UPPER EXTREMITY VENOUS DUPLEX Referring Phys: --------------------------------------------------------------------------------  Indications: Fever of unknown origin, [redacted] weeks pregnant Limitations: Line. Comparison Study: No prior study Performing Technologist: Sharion Dove RVS  Examination Guidelines: A complete evaluation includes B-mode imaging, spectral Doppler, color Doppler, and power Doppler as needed of all accessible portions of each vessel. Bilateral testing is considered an integral part of a complete examination. Limited examinations for reoccurring indications may be performed as noted.  Right Findings: +----------+------------+---------+-----------+----------+--------------------+  RIGHT       Compressible Phasicity Spontaneous Properties       Summary         +----------+------------+---------+-----------+----------+--------------------+  IJV            Full        Yes        Yes                                      +----------+------------+---------+-----------+----------+--------------------+  Subclavian                 Yes        Yes                                      +----------+------------+---------+-----------+----------+--------------------+  Axillary                   Yes        Yes                                      +----------+------------+---------+-----------+----------+--------------------+  Brachial       Full                                                            +----------+------------+---------+-----------+----------+--------------------+  Radial         Full                                                            +----------+------------+---------+-----------+----------+--------------------+  Ulnar                                                      patent by color     +----------+------------+---------+-----------+----------+--------------------+  Cephalic       Full                                                            +----------+------------+---------+-----------+----------+--------------------+  Basilic        None                                        Small segment of                                                                   acute SVT        +----------+------------+---------+-----------+----------+--------------------+  Left Findings: +----------+------------+---------+-----------+----------+--------------+  LEFT       Compressible Phasicity Spontaneous Properties    Summary      +----------+------------+---------+-----------+----------+--------------+  IJV            Full        Yes        Yes                                +----------+------------+---------+-----------+----------+--------------+  Subclavian                 Yes        Yes                                 +----------+------------+---------+-----------+----------+--------------+  Axillary                   Yes        Yes                                +----------+------------+---------+-----------+----------+--------------+  Brachial       Full                                                      +----------+------------+---------+-----------+----------+--------------+  Radial         Full                                                      +----------+------------+---------+-----------+----------+--------------+  Ulnar                                                    Not visualized  +----------+------------+---------+-----------+----------+--------------+  Cephalic       Full                                                      +----------+------------+---------+-----------+----------+--------------+  Basilic        Full                                                      +----------+------------+---------+-----------+----------+--------------+  Summary:  Right: No evidence of deep vein thrombosis in the upper extremity. Findings consistent with acute superficial vein thrombosis involving the right basilic vein.  Left: No evidence of deep vein thrombosis in the upper extremity. No evidence of superficial vein thrombosis in the upper extremity.  *See table(s) above for measurements and observations.  Diagnosing physician: Servando Snare MD Electronically signed by Servando Snare MD on 10/13/2021 at 5:53:37 PM.    Final    [place] MFM OB FOLLOW UP  Result Date: 10/14/2021 ----------------------------------------------------------------------  OBSTETRICS REPORT                       (Signed Final 10/14/2021 08:25 am) ---------------------------------------------------------------------- Patient Info  ID #:       [id number]                          D.O.B.:  23-Aug-1996 (25 yrs)  Name:       Dawn Small                  Visit Date: 10/13/2021 12:21 pm  ---------------------------------------------------------------------- Performed By  Attending:        Sander Nephew      Ref. Address:     [place]                    MD  [place]  Performed By:     Valda Favia          [facility]                    [place]  Referred By:      Woodroe Mode                    MD ---------------------------------------------------------------------- Orders  #  Description                           Code        Ordered By  1  [place] MFM OB FOLLOW UP                   [phone]    Emeterio Reeve ----------------------------------------------------------------------  #  Order #                     Accession #                Episode #  1  [id number]                   [id number]                 [id number] ---------------------------------------------------------------------- Indications  [redacted] weeks gestation of pregnancy                Z3A.38  Poor obstetric history-Recurrent (habitual)    O26.20  abortion (4 consecutive iab's)  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 Cord Insertion:      Previously Visualized  Amniotic Fluid  AFI FV:      Within normal limits  AFI Sum(cm)     %Tile       Largest Pocket(cm)  15.3            61          5  RUQ(cm)  RLQ(cm)       LUQ(cm)        LLQ(cm)  4.5           3             5               2.8 ---------------------------------------------------------------------- Biometry  BPD:        93  mm     G. Age:  37w 6d          56  %    CI:        80.14   %    70 - 86                                                          FL/HC:      22.7   %    20.6 - 23.4  HC:      328.2  mm     G. Age:  37w 2d         10  %    HC/AC:      0.94        0.87 - 1.06  AC:      347.7  mm     G. Age:  38w 5d         70  %    FL/BPD:     80.1   %    71 - 87  FL:       74.5  mm     G. Age:  38w 1d         40  %    FL/AC:      21.4   %    20 - 24  Est. FW:    3448  gm    7 lb 10 oz      56  % ---------------------------------------------------------------------- OB History  Blood Type:   A+  Gravidity:    5  TOP:          4        Living:  0 ---------------------------------------------------------------------- Gestational Age  LMP:           38w 5d        Date:  01/15/21                 EDD:   10/22/21  U/S Today:     38w 0d                                        EDD:   10/27/21  Best:          38w 5d     Det. By:  LMP  (01/15/21)          EDD:   10/22/21 ---------------------------------------------------------------------- Anatomy  Cranium:               Appears normal         Aortic Arch:            Previously seen  Cavum:                 Previously seen        Ductal  Arch:            Previously seen  Ventricles:            Previously seen        Diaphragm:              Previously seen  Choroid Plexus:        Previously seen        Stomach:                Appears normal, left                                                                        sided  Cerebellum:            Previously seen        Abdomen:                Previously seen  Posterior Fossa:       Previously seen        Abdominal Wall:         Previously seen  Nuchal Fold:           Previously seen        Cord Vessels:           Previously seen  Face:                  Orbits and profile     Kidneys:                Previously seen                         previously seen  Lips:                  Previously seen        Bladder:                Appears normal  Thoracic:              Previously  seen        Spine:                  Previously seen  Heart:                 Appears normal         Upper Extremities:      Previously seen                         (4CH, axis, and                         situs)  RVOT:                  Previously seen        Lower Extremities:      Previously seen  LVOT:                  Previously seen  Other:  Fetus appears to be a female. BCV previously visualized. 3VV and          3VTV  appear normal previously ---------------------------------------------------------------------- Cervix Uterus Adnexa  Cervix  Not visualized (advanced GA >24wks)  Uterus  No abnormality visualized.  Right Ovary  No adnexal mass visualized.  Left Ovary  No adnexal mass visualized.  Cul De Sac  No free fluid seen.  Adnexa  No abnormality visualized. ---------------------------------------------------------------------- Impression  Follow up growth due advanced maternal age and inpatient  admission.  Normal interval growth with measurements consistent with  dates  Good fetal movement and amniotic fluid volume ---------------------------------------------------------------------- Recommendations  Follow up as clinically indicated. ----------------------------------------------------------------------               Sander Nephew, MD Electronically Signed Final Report   10/14/2021 08:25 am ----------------------------------------------------------------------   I spent more than 45 minutes for this patient encounter including review of prior medical records, coordination of care with primary/other specialist with greater than 50% of time being face to face/counseling and discussing diagnostics/treatment plan with the patient/family.  Electronically signed by:   Rosiland Oz, MD Infectious Disease Physician [facility] Pager: [phone]

## 2021-10-14 NOTE — Anesthesia Procedure Notes (Addendum)
Epidural Patient location during procedure: OB Start time: 10/14/2021 11:07 PM End time: 10/14/2021 11:22 PM  Staffing Anesthesiologist: Mal Amabile, MD Performed: anesthesiologist   Preanesthetic Checklist Completed: patient identified, IV checked, site marked, risks and benefits discussed, surgical consent, monitors and equipment checked, pre-op evaluation and timeout performed  Epidural Patient position: sitting Prep: DuraPrep and site prepped and draped Patient monitoring: continuous pulse ox and blood pressure Approach: midline Location: L3-L4 Injection technique: LOR air  Needle:  Needle type: Tuohy  Needle gauge: 17 G Needle length: 9 cm and 9 Needle insertion depth: 7 cm Catheter type: closed end flexible Catheter size: 19 Gauge Catheter at skin depth: 12 cm Test dose: negative and Other  Assessment Events: blood not aspirated, injection not painful, no injection resistance, no paresthesia and negative IV test  Additional Notes Patient identified. Risks and benefits discussed including failed block, incomplete  Pain control, post dural puncture headache, nerve damage, paralysis, blood pressure Changes, nausea, vomiting, reactions to medications-both toxic and allergic and post Partum back pain. All questions were answered. Patient expressed understanding and wished to proceed. Sterile technique was used throughout procedure. Epidural site was Dressed with sterile barrier dressing. No paresthesias, signs of intravascular injection Or signs of intrathecal spread were encountered. Attempt x 2. Difficult due to edema and poor landmarks. Patient was more comfortable after the epidural was dosed. Please see RN's note for documentation of vital signs and FHR which are stable. Reason for block:procedure for pain

## 2021-10-15 ENCOUNTER — Encounter (HOSPITAL_COMMUNITY)
Admission: AD | Disposition: A | Discharge status: Home / Self Care | Payer: Self-pay | Source: Ambulatory Visit | Attending: Obstetrics and Gynecology

## 2021-10-15 ENCOUNTER — Encounter (HOSPITAL_COMMUNITY): Payer: Self-pay | Admitting: Obstetrics and Gynecology

## 2021-10-15 ENCOUNTER — Encounter: Payer: Medicaid Other | Admitting: Nurse Practitioner

## 2021-10-15 DIAGNOSIS — N12 Tubulo-interstitial nephritis, not specified as acute or chronic: Secondary | ICD-10-CM | POA: Diagnosis not present

## 2021-10-15 DIAGNOSIS — O411230 Chorioamnionitis, third trimester, not applicable or unspecified: Secondary | ICD-10-CM | POA: Diagnosis not present

## 2021-10-15 DIAGNOSIS — N179 Acute kidney failure, unspecified: Secondary | ICD-10-CM | POA: Diagnosis not present

## 2021-10-15 DIAGNOSIS — D509 Iron deficiency anemia, unspecified: Secondary | ICD-10-CM | POA: Diagnosis not present

## 2021-10-15 DIAGNOSIS — D62 Acute posthemorrhagic anemia: Secondary | ICD-10-CM | POA: Diagnosis not present

## 2021-10-15 DIAGNOSIS — O2303 Infections of kidney in pregnancy, third trimester: Secondary | ICD-10-CM | POA: Diagnosis not present

## 2021-10-15 DIAGNOSIS — Z3A39 39 weeks gestation of pregnancy: Secondary | ICD-10-CM | POA: Diagnosis not present

## 2021-10-15 DIAGNOSIS — O9882 Other maternal infectious and parasitic diseases complicating childbirth: Secondary | ICD-10-CM | POA: Diagnosis not present

## 2021-10-15 DIAGNOSIS — O904 Postpartum acute kidney failure: Secondary | ICD-10-CM | POA: Diagnosis not present

## 2021-10-15 DIAGNOSIS — O99824 Streptococcus B carrier state complicating childbirth: Secondary | ICD-10-CM | POA: Diagnosis not present

## 2021-10-15 DIAGNOSIS — O9902 Anemia complicating childbirth: Secondary | ICD-10-CM | POA: Diagnosis not present

## 2021-10-15 DIAGNOSIS — J90 Pleural effusion, not elsewhere classified: Secondary | ICD-10-CM | POA: Diagnosis not present

## 2021-10-15 DIAGNOSIS — O43893 Other placental disorders, third trimester: Secondary | ICD-10-CM | POA: Diagnosis not present

## 2021-10-15 DIAGNOSIS — Z20822 Contact with and (suspected) exposure to covid-19: Secondary | ICD-10-CM | POA: Diagnosis not present

## 2021-10-15 LAB — LACTIC ACID, PLASMA
Lactic Acid, Venous: 4.4 mmol/L (ref 0.5–1.9)
Lactic Acid, Venous: 1.7 mmol/L (ref 0.5–1.9)

## 2021-10-15 LAB — CBC
HCT: 33.1 % — ABNORMAL LOW (ref 36.0–46.0)
HCT: 17.4 % — ABNORMAL LOW (ref 36.0–46.0)
HCT: 27.4 % — ABNORMAL LOW (ref 36.0–46.0)
HCT: 26.2 % — ABNORMAL LOW (ref 36.0–46.0)
Hemoglobin: 8.9 g/dL — ABNORMAL LOW (ref 12.0–15.0)
Hemoglobin: 5.4 g/dL — CL (ref 12.0–15.0)
Hemoglobin: 9 g/dL — ABNORMAL LOW (ref 12.0–15.0)
Hemoglobin: 8.6 g/dL — ABNORMAL LOW (ref 12.0–15.0)
MCH: 18.4 pg — ABNORMAL LOW (ref 26.0–34.0)
MCH: 22.7 pg — ABNORMAL LOW (ref 26.0–34.0)
MCH: 24.5 pg — ABNORMAL LOW (ref 26.0–34.0)
MCH: 24.5 pg — ABNORMAL LOW (ref 26.0–34.0)
MCHC: 26.9 g/dL — ABNORMAL LOW (ref 30.0–36.0)
MCHC: 31 g/dL (ref 30.0–36.0)
MCHC: 32.8 g/dL (ref 30.0–36.0)
MCHC: 32.8 g/dL (ref 30.0–36.0)
MCV: 68.2 fL — ABNORMAL LOW (ref 80.0–100.0)
MCV: 73.1 fL — ABNORMAL LOW (ref 80.0–100.0)
MCV: 74.7 fL — ABNORMAL LOW (ref 80.0–100.0)
MCV: 74.6 fL — ABNORMAL LOW (ref 80.0–100.0)
Platelets: 302 10*3/uL (ref 150–400)
Platelets: 146 10*3/uL — ABNORMAL LOW (ref 150–400)
Platelets: 133 10*3/uL — ABNORMAL LOW (ref 150–400)
Platelets: 148 10*3/uL — ABNORMAL LOW (ref 150–400)
RBC: 4.85 MIL/uL (ref 3.87–5.11)
RBC: 2.38 MIL/uL — ABNORMAL LOW (ref 3.87–5.11)
RBC: 3.67 MIL/uL — ABNORMAL LOW (ref 3.87–5.11)
RBC: 3.51 MIL/uL — ABNORMAL LOW (ref 3.87–5.11)
RDW: 32.9 % — ABNORMAL HIGH (ref 11.5–15.5)
RDW: 25.9 % — ABNORMAL HIGH (ref 11.5–15.5)
RDW: 25.8 % — ABNORMAL HIGH (ref 11.5–15.5)
WBC: 18.7 10*3/uL — ABNORMAL HIGH (ref 4.0–10.5)
WBC: 16.1 10*3/uL — ABNORMAL HIGH (ref 4.0–10.5)
WBC: 20.2 10*3/uL — ABNORMAL HIGH (ref 4.0–10.5)
WBC: 17.5 10*3/uL — ABNORMAL HIGH (ref 4.0–10.5)
nRBC: 2 % — ABNORMAL HIGH (ref 0.0–0.2)
nRBC: 2 % — ABNORMAL HIGH (ref 0.0–0.2)
nRBC: 0.7 % — ABNORMAL HIGH (ref 0.0–0.2)
nRBC: 0.5 % — ABNORMAL HIGH (ref 0.0–0.2)

## 2021-10-15 LAB — PHOSPHORUS: Phosphorus: 4.4 mg/dL (ref 2.5–4.6)

## 2021-10-15 LAB — FIBRINOGEN: Fibrinogen: 183 mg/dL — ABNORMAL LOW (ref 210–475)

## 2021-10-15 LAB — PROTIME-INR
INR: 1.7 — ABNORMAL HIGH (ref 0.8–1.2)
Prothrombin Time: 20.2 seconds — ABNORMAL HIGH (ref 11.4–15.2)

## 2021-10-15 LAB — PREPARE RBC (CROSSMATCH)

## 2021-10-15 SURGERY — Surgical Case
Anesthesia: Epidural | Wound class: Clean Contaminated

## 2021-10-15 MED ORDER — STERILE WATER FOR IRRIGATION IR SOLN
Status: DC | PRN
  Administered 2021-10-15: 1000 mL

## 2021-10-15 MED ORDER — TRANEXAMIC ACID-NACL 1000-0.7 MG/100ML-% IV SOLN
INTRAVENOUS | Status: AC
  Filled 2021-10-15: qty 100

## 2021-10-15 MED ORDER — WITCH HAZEL-GLYCERIN EX PADS
1.0000 "application " | MEDICATED_PAD | CUTANEOUS | Status: DC | PRN
  Filled 2021-10-15: qty 100

## 2021-10-15 MED ORDER — PRENATAL MULTIVITAMIN CH
1.0000 | ORAL_TABLET | Freq: Every day | ORAL | Status: DC
  Administered 2021-10-16 – 2021-10-17 (×2): 1 via ORAL
  Filled 2021-10-15 (×2): qty 1

## 2021-10-15 MED ORDER — SIMETHICONE 80 MG PO CHEW
80.0000 mg | CHEWABLE_TABLET | Freq: Three times a day (TID) | ORAL | Status: DC
  Administered 2021-10-16 – 2021-10-17 (×5): 80 mg via ORAL
  Filled 2021-10-15 (×5): qty 1

## 2021-10-15 MED ORDER — OXYCODONE HCL 5 MG/5ML PO SOLN: 5.0000 mg | Freq: Once | ORAL | Status: DC | PRN

## 2021-10-15 MED ORDER — CEFAZOLIN SODIUM-DEXTROSE 2-3 GM-%(50ML) IV SOLR
INTRAVENOUS | Status: DC | PRN
  Administered 2021-10-15: 2 g via INTRAVENOUS

## 2021-10-15 MED ORDER — SCOPOLAMINE 1 MG/3DAYS TD PT72
1.0000 | MEDICATED_PATCH | Freq: Once | TRANSDERMAL | Status: DC
  Administered 2021-10-15: 1.5 mg via TRANSDERMAL

## 2021-10-15 MED ORDER — SODIUM CHLORIDE 0.9 % IV SOLN: INTRAVENOUS | Status: DC | PRN

## 2021-10-15 MED ORDER — ORAL CARE MOUTH RINSE
15.0000 mL | Freq: Two times a day (BID) | OROMUCOSAL | Status: DC
  Administered 2021-10-15 – 2021-10-16 (×2): 15 mL via OROMUCOSAL

## 2021-10-15 MED ORDER — NALOXONE HCL 4 MG/10ML IJ SOLN
1.0000 ug/kg/h | INTRAVENOUS | Status: DC | PRN
  Filled 2021-10-15: qty 5

## 2021-10-15 MED ORDER — MORPHINE SULFATE (PF) 0.5 MG/ML IJ SOLN
INTRAMUSCULAR | Status: AC
  Filled 2021-10-15: qty 10

## 2021-10-15 MED ORDER — TERBUTALINE SULFATE 1 MG/ML IJ SOLN
0.2500 mg | Freq: Once | INTRAMUSCULAR | Status: AC | PRN
  Administered 2021-10-15: 0.25 mg via SUBCUTANEOUS
  Filled 2021-10-15 (×2): qty 1

## 2021-10-15 MED ORDER — SODIUM CHLORIDE 0.9% IV SOLUTION: Freq: Once | INTRAVENOUS | Status: DC

## 2021-10-15 MED ORDER — LIDOCAINE-EPINEPHRINE (PF) 2 %-1:200000 IJ SOLN
INTRAMUSCULAR | Status: DC | PRN
  Administered 2021-10-15 (×2): 5 mL via EPIDURAL
  Administered 2021-10-15: 3 mL via EPIDURAL

## 2021-10-15 MED ORDER — DIPHENHYDRAMINE HCL 25 MG PO CAPS: 25.0000 mg | ORAL_CAPSULE | Freq: Four times a day (QID) | ORAL | Status: DC | PRN

## 2021-10-15 MED ORDER — ALBUMIN HUMAN 5 % IV SOLN
INTRAVENOUS | Status: AC
  Filled 2021-10-15: qty 250

## 2021-10-15 MED ORDER — MEPERIDINE HCL 25 MG/ML IJ SOLN: 6.2500 mg | INTRAMUSCULAR | Status: DC | PRN

## 2021-10-15 MED ORDER — SODIUM CHLORIDE 0.9% IV SOLUTION
Freq: Once | INTRAVENOUS | Status: DC
Start: 2021-10-15 — End: 2021-10-15

## 2021-10-15 MED ORDER — LACTATED RINGERS IV SOLN
INTRAVENOUS | Status: DC | PRN
Start: 2021-10-15 — End: 2021-10-15

## 2021-10-15 MED ORDER — SIMETHICONE 80 MG PO CHEW
80.0000 mg | CHEWABLE_TABLET | ORAL | Status: DC | PRN
  Filled 2021-10-15: qty 1

## 2021-10-15 MED ORDER — DIPHENHYDRAMINE HCL 50 MG/ML IJ SOLN: 12.5000 mg | INTRAMUSCULAR | Status: DC | PRN

## 2021-10-15 MED ORDER — OXYTOCIN-SODIUM CHLORIDE 30-0.9 UT/500ML-% IV SOLN
INTRAVENOUS | Status: DC | PRN
  Administered 2021-10-15: 300 mL via INTRAVENOUS

## 2021-10-15 MED ORDER — METHYLERGONOVINE MALEATE 0.2 MG/ML IJ SOLN
INTRAMUSCULAR | Status: AC
  Filled 2021-10-15: qty 1

## 2021-10-15 MED ORDER — ALBUMIN HUMAN 5 % IV SOLN
12.5000 g | Freq: Once | INTRAVENOUS | Status: AC
  Administered 2021-10-15: 12.5 g via INTRAVENOUS
  Filled 2021-10-15: qty 250

## 2021-10-15 MED ORDER — OXYTOCIN-SODIUM CHLORIDE 30-0.9 UT/500ML-% IV SOLN
1.0000 m[IU]/min | INTRAVENOUS | Status: DC
  Administered 2021-10-15: 2 m[IU]/min via INTRAVENOUS

## 2021-10-15 MED ORDER — NALOXONE HCL 0.4 MG/ML IJ SOLN: 0.4000 mg | INTRAMUSCULAR | Status: DC | PRN

## 2021-10-15 MED ORDER — PHENYLEPHRINE 40 MCG/ML (10ML) SYRINGE FOR IV PUSH (FOR BLOOD PRESSURE SUPPORT)
PREFILLED_SYRINGE | INTRAVENOUS | Status: AC
  Filled 2021-10-15: qty 20

## 2021-10-15 MED ORDER — TRANEXAMIC ACID 1000 MG/10ML IV SOLN
INTRAVENOUS | Status: DC | PRN
  Administered 2021-10-15: 1000 mg via INTRAVENOUS

## 2021-10-15 MED ORDER — SODIUM CHLORIDE 0.9 % IV SOLN
3.0000 g | Freq: Four times a day (QID) | INTRAVENOUS | Status: DC
  Administered 2021-10-15 – 2021-10-17 (×8): 3 g via INTRAVENOUS
  Filled 2021-10-15 (×8): qty 8

## 2021-10-15 MED ORDER — METHYLERGONOVINE MALEATE 0.2 MG PO TABS
0.2000 mg | ORAL_TABLET | ORAL | Status: DC | PRN
  Administered 2021-10-15: 0.2 mg via ORAL
  Filled 2021-10-15: qty 1

## 2021-10-15 MED ORDER — KETOROLAC TROMETHAMINE 30 MG/ML IJ SOLN
30.0000 mg | Freq: Once | INTRAMUSCULAR | Status: AC | PRN
  Administered 2021-10-15: 30 mg via INTRAVENOUS

## 2021-10-15 MED ORDER — COCONUT OIL OIL
1.0000 "application " | TOPICAL_OIL | Status: DC | PRN
  Filled 2021-10-15: qty 120

## 2021-10-15 MED ORDER — SODIUM CHLORIDE 0.9 % IV SOLN
INTRAVENOUS | Status: DC | PRN
  Administered 2021-10-15: 500 mg via INTRAVENOUS

## 2021-10-15 MED ORDER — ONDANSETRON HCL 4 MG/2ML IJ SOLN
INTRAMUSCULAR | Status: AC
  Filled 2021-10-15: qty 2

## 2021-10-15 MED ORDER — SODIUM CHLORIDE 0.9 % IR SOLN
Status: DC | PRN
  Administered 2021-10-15: 1000 mL

## 2021-10-15 MED ORDER — KETOROLAC TROMETHAMINE 30 MG/ML IJ SOLN
INTRAMUSCULAR | Status: AC
  Filled 2021-10-15: qty 1

## 2021-10-15 MED ORDER — ONDANSETRON HCL 4 MG/2ML IJ SOLN: 4.0000 mg | Freq: Once | INTRAMUSCULAR | Status: DC | PRN

## 2021-10-15 MED ORDER — MORPHINE SULFATE (PF) 0.5 MG/ML IJ SOLN
INTRAMUSCULAR | Status: DC | PRN
  Administered 2021-10-15: 3 mg via EPIDURAL

## 2021-10-15 MED ORDER — ACETAMINOPHEN 500 MG PO TABS
1000.0000 mg | ORAL_TABLET | Freq: Four times a day (QID) | ORAL | Status: DC
  Filled 2021-10-15: qty 2

## 2021-10-15 MED ORDER — DIBUCAINE (PERIANAL) 1 % EX OINT
1.0000 "application " | TOPICAL_OINTMENT | CUTANEOUS | Status: DC | PRN
  Filled 2021-10-15: qty 28

## 2021-10-15 MED ORDER — SENNOSIDES-DOCUSATE SODIUM 8.6-50 MG PO TABS
2.0000 | ORAL_TABLET | Freq: Every day | ORAL | Status: DC
  Administered 2021-10-16 – 2021-10-17 (×2): 2 via ORAL
  Filled 2021-10-15 (×2): qty 2

## 2021-10-15 MED ORDER — OXYCODONE HCL 5 MG PO TABS: 5.0000 mg | ORAL_TABLET | Freq: Once | ORAL | Status: DC | PRN

## 2021-10-15 MED ORDER — FENTANYL CITRATE (PF) 100 MCG/2ML IJ SOLN
INTRAMUSCULAR | Status: AC
  Filled 2021-10-15: qty 2

## 2021-10-15 MED ORDER — METHYLERGONOVINE MALEATE 0.2 MG/ML IJ SOLN
0.2000 mg | INTRAMUSCULAR | Status: DC | PRN
  Filled 2021-10-15: qty 1

## 2021-10-15 MED ORDER — CLINDAMYCIN PHOSPHATE 900 MG/50ML IV SOLN
900.0000 mg | Freq: Once | INTRAVENOUS | Status: DC
Start: 2021-10-15 — End: 2021-10-15

## 2021-10-15 MED ORDER — CHLORHEXIDINE GLUCONATE CLOTH 2 % EX PADS
6.0000 | MEDICATED_PAD | Freq: Every day | CUTANEOUS | Status: DC
  Administered 2021-10-15 – 2021-10-16 (×2): 6 via TOPICAL

## 2021-10-15 MED ORDER — HYDROMORPHONE HCL 1 MG/ML IJ SOLN: 0.2500 mg | INTRAMUSCULAR | Status: DC | PRN

## 2021-10-15 MED ORDER — ONDANSETRON HCL 4 MG/2ML IJ SOLN
INTRAMUSCULAR | Status: DC | PRN
  Administered 2021-10-15: 4 mg via INTRAVENOUS

## 2021-10-15 MED ORDER — LACTATED RINGERS IV SOLN: INTRAVENOUS | Status: DC

## 2021-10-15 MED ORDER — SODIUM CHLORIDE 0.9 % IV SOLN
INTRAVENOUS | Status: AC
  Filled 2021-10-15: qty 5

## 2021-10-15 MED ORDER — ENOXAPARIN SODIUM 40 MG/0.4ML IJ SOSY
40.0000 mg | PREFILLED_SYRINGE | INTRAMUSCULAR | Status: DC
  Administered 2021-10-17: 40 mg via SUBCUTANEOUS
  Filled 2021-10-15: qty 0.4

## 2021-10-15 MED ORDER — SODIUM CHLORIDE 0.9% FLUSH: 3.0000 mL | INTRAVENOUS | Status: DC | PRN

## 2021-10-15 MED ORDER — OXYCODONE HCL 5 MG PO TABS
5.0000 mg | ORAL_TABLET | ORAL | Status: DC | PRN
  Administered 2021-10-16: 10 mg via ORAL
  Administered 2021-10-16: 5 mg via ORAL
  Administered 2021-10-17: 10 mg via ORAL
  Filled 2021-10-15 (×3): qty 2

## 2021-10-15 MED ORDER — OXYTOCIN-SODIUM CHLORIDE 30-0.9 UT/500ML-% IV SOLN
INTRAVENOUS | Status: AC
  Filled 2021-10-15: qty 500

## 2021-10-15 MED ORDER — MENTHOL 3 MG MT LOZG
1.0000 | LOZENGE | OROMUCOSAL | Status: DC | PRN
  Filled 2021-10-15: qty 9

## 2021-10-15 MED ORDER — SCOPOLAMINE 1 MG/3DAYS TD PT72
MEDICATED_PATCH | TRANSDERMAL | Status: AC
  Filled 2021-10-15: qty 1

## 2021-10-15 MED ORDER — DIPHENHYDRAMINE HCL 25 MG PO CAPS: 25.0000 mg | ORAL_CAPSULE | ORAL | Status: DC | PRN

## 2021-10-15 MED ORDER — ONDANSETRON HCL 4 MG/2ML IJ SOLN: 4.0000 mg | Freq: Three times a day (TID) | INTRAMUSCULAR | Status: DC | PRN

## 2021-10-15 MED ORDER — FENTANYL CITRATE (PF) 100 MCG/2ML IJ SOLN
INTRAMUSCULAR | Status: DC | PRN
  Administered 2021-10-15: 100 ug via EPIDURAL

## 2021-10-15 MED ORDER — PHENYLEPHRINE HCL (PRESSORS) 10 MG/ML IV SOLN
INTRAVENOUS | Status: DC | PRN
Start: 2021-10-15 — End: 2021-10-15
  Administered 2021-10-15: 120 ug via INTRAVENOUS
  Administered 2021-10-15 (×2): 160 ug via INTRAVENOUS
  Administered 2021-10-15: 240 ug via INTRAVENOUS
  Administered 2021-10-15 (×2): 160 ug via INTRAVENOUS
  Administered 2021-10-15: 120 ug via INTRAVENOUS
  Administered 2021-10-15: 160 ug via INTRAVENOUS

## 2021-10-15 SURGICAL SUPPLY — 28 items
CHLORAPREP W/TINT 26ML (MISCELLANEOUS) ×2 IMPLANT
CLAMP CORD UMBIL (MISCELLANEOUS) IMPLANT
DRSG OPSITE POSTOP 4X10 (GAUZE/BANDAGES/DRESSINGS) ×2 IMPLANT
ELECT REM PT RETURN 9FT ADLT (ELECTROSURGICAL) ×2
ELECTRODE REM PT RTRN 9FT ADLT (ELECTROSURGICAL) ×1 IMPLANT
EXTRACTOR VACUUM M CUP 4 TUBE (SUCTIONS) IMPLANT
GLOVE BIOGEL PI IND STRL 6.5 (GLOVE) ×1 IMPLANT
GLOVE BIOGEL PI IND STRL 7.0 (GLOVE) ×1 IMPLANT
GLOVE BIOGEL PI INDICATOR 6.5 (GLOVE) ×1
GLOVE BIOGEL PI INDICATOR 7.0 (GLOVE) ×1
GLOVE SURG SS PI 6.5 STRL IVOR (GLOVE) ×2 IMPLANT
GOWN STRL REUS W/TWL LRG LVL3 (GOWN DISPOSABLE) ×4 IMPLANT
KIT ABG SYR 3ML LUER SLIP (SYRINGE) IMPLANT
NDL HYPO 25X5/8 SAFETYGLIDE (NEEDLE) IMPLANT
NEEDLE HYPO 25X5/8 SAFETYGLIDE (NEEDLE) IMPLANT
NS IRRIG 1000ML POUR BTL (IV SOLUTION) ×2 IMPLANT
PACK C SECTION WH (CUSTOM PROCEDURE TRAY) ×2 IMPLANT
PAD ABD 7.5X8 STRL (GAUZE/BANDAGES/DRESSINGS) ×1 IMPLANT
PAD OB MATERNITY 4.3X12.25 (PERSONAL CARE ITEMS) ×2 IMPLANT
PENCIL SMOKE EVAC W/HOLSTER (ELECTROSURGICAL) ×2 IMPLANT
RTRCTR C-SECT PINK 25CM LRG (MISCELLANEOUS) IMPLANT
SPONGE GAUZE 4X4 12PLY STER LF (GAUZE/BANDAGES/DRESSINGS) ×2 IMPLANT
SUT PLAIN 0 NONE (SUTURE) IMPLANT
SUT VIC AB 0 CT1 36 (SUTURE) ×8 IMPLANT
SUT VIC AB 4-0 KS 27 (SUTURE) ×2 IMPLANT
TOWEL OR 17X24 6PK STRL BLUE (TOWEL DISPOSABLE) ×2 IMPLANT
TRAY FOLEY W/BAG SLVR 14FR LF (SET/KITS/TRAYS/PACK) ×2 IMPLANT
WATER STERILE IRR 1000ML POUR (IV SOLUTION) ×2 IMPLANT

## 2021-10-15 NOTE — Anesthesia Postprocedure Evaluation (Signed)
Anesthesia Post Note  Patient: Dawn Small  Procedure(s) Performed: CESAREAN SECTION     Patient location during evaluation: Mother Baby Anesthesia Type: Epidural Level of consciousness: oriented and awake and alert Pain management: pain level controlled Vital Signs Assessment: post-procedure vital signs reviewed and stable Respiratory status: spontaneous breathing and respiratory function stable Cardiovascular status: blood pressure returned to baseline and stable Postop Assessment: no headache, no backache, no apparent nausea or vomiting and able to ambulate Anesthetic complications: no   No notable events documented.  Last Vitals:  Vitals:   10/15/21 1800 10/15/21 1830  BP: 114/77 106/66  Pulse: 84 88  Resp: (!) 23 18  Temp:    SpO2: 96% 97%    Last Pain:  Vitals:   10/15/21 1521  TempSrc: Oral  PainSc:    Pain Goal: Patients Stated Pain Goal: 4 (10/10/21 1940)                 Trevor Iha

## 2021-10-15 NOTE — Lactation Note (Signed)
This note was copied from a baby's chart. Lactation Consultation Note Mother is currently in ICU. I spoke with her nurse, L. Wilson, who confirms that mom's plan is to formula feed. RN and mother are aware that [facility] team is available to consult prn. [place] services are complete at this time.   Patient Name: Dawn Small [id number] Date: 10/15/2021   Age:27 hours  Feeding Nipple Type: Slow - flow   [name] 10/15/2021, 4:40 PM

## 2021-10-15 NOTE — Sepsis Progress Note (Signed)
Notified provider of need to order lactic acid and blood cultures.  

## 2021-10-15 NOTE — Discharge Summary (Signed)
Postpartum Discharge Summary  Date of Service updated 10/17/2021      Patient Name: Dawn Small DOB: November 05, 1995 MRN: [id number]  Date of admission: 10/05/2021 Delivery date:10/15/2021  Delivering provider: CONSTANT, PEGGY  Date of discharge:  10/17/2021  Admitting diagnosis: Pyelonephritis affecting pregnancy [O23.00] Intrauterine pregnancy: [redacted]w[redacted]d     Secondary diagnosis:  Principal Problem:   Cesarean delivery delivered Active Problems:   Pyelonephritis affecting pregnancy   Back pain affecting pregnancy in third trimester   Pyelonephritis   Urinary tract infection affecting care of mother in third trimester, antepartum   FUO (fever of unknown origin)   Pleural effusion due to another disorder   Chorioamnionitis in third trimester  Additional problems: hemorrhage and anemia    Discharge diagnosis: Term Pregnancy Delivered                                              Post partum procedures:blood transfusion Augmentation: AROM, Pitocin, Cytotec, and IP Foley Complications: Intrauterine Inflammation or infection (Chorioamniotis)  Hospital course: Induction of Labor With Cesarean Section   26 y.o. yo [id number] at [redacted]w[redacted]d was admitted to the hospital 10/05/2021 for pyelonephritis affecting pregnancy. She continued to have fevers despite appropriate antibiotic treatment. ID and MFM consulted. It was ultimately suspected that patient's ongoing fevers were possibly due to chorioamnionitis. She was transferred to L&D for induction. She received Cytotec and foley balloon placement followed by AROM and Pitocin. Fetal tachycardia was noted intermittently early in her labor associated with her fevers; however, this became more persistent and associated with late decelerations on the morning of 1/11. Given that patient was remote from delivery, the patient went for cesarean section due to Non-Reassuring FHR. Delivery details are as follows: Her intraoperative blood loss was 1200 and another 500 in  the PACU was noted and the patient became hypotensive. She received a total 4 units PRBC with a Hb of 5.4. She was sent to the MICU but stabilized quickly and was sent to [facility] unit that evening. Membrane Rupture Time/Date: 3:18 AM ,10/15/2021   Delivery Method:C-Section, Low Transverse  Details of operation can be found in separate operative note. She was continued on Unasyn  for antibiotics post-operatively. Her fevers resolved. Patient had a postpartum course with rapid improvement and she had no significant fevers. She was transitioned to oral Augmentin for discharge. She is ambulating, tolerating a regular diet, passing flatus, and urinating well.  Patient is discharged home in stable condition on 10/17/21.      Newborn Data: Birth date:10/15/2021  Birth time:7:39 AM  Gender:Female  Living status:Living  Apgars:9 ,10  Weight:3170 g                                Magnesium Sulfate received: No BMZ received: No Rhophylac: N/A MMR: N/A T-DaP: Given prenatally Flu: Given prenatally  Transfusion: Yes (2 units pRBCs pre-operatively)  Physical exam  Vitals:   10/17/21 0222 10/17/21 0905 10/17/21 1100 10/17/21 1211  BP: 132/88 120/77  105/72  Pulse: 97 (!) 117  (!) 126  Resp: $Remo'18 18  18  'DEgxc$ Temp: 97.8 F (36.6 C) (!) 100.7 F (38.2 C) 98.2 F (36.8 C) 98.2 F (36.8 C)  TempSrc: Oral Oral Axillary Oral  SpO2: 93% 93%  95%  Weight:      Height:  General: alert, cooperative, and no distress Lochia: appropriate Uterine Fundus: firm Incision: Healing well with no significant drainage, Dressing is clean, dry, and intact DVT Evaluation: No evidence of DVT seen on physical exam.  Labs: Lab Results  Component Value Date   WBC 15.5 (H) 10/17/2021   HGB 8.1 (L) 10/17/2021   HCT 25.4 (L) 10/17/2021   MCV 75.8 (L) 10/17/2021   PLT 285 10/17/2021   CMP Latest Ref Rng & Units 10/17/2021  Glucose 70 - 99 mg/dL 71  BUN 6 - 20 mg/dL 10  Creatinine 0.44 - 1.00 mg/dL 1.05(H)  Sodium 135 -  145 mmol/L 134(L)  Potassium 3.5 - 5.1 mmol/L 3.8  Chloride 98 - 111 mmol/L 103  CO2 22 - 32 mmol/L 24  Calcium 8.9 - 10.3 mg/dL 7.0(L)  Total Protein 6.5 - 8.1 g/dL 4.8(L)  Total Bilirubin 0.3 - 1.2 mg/dL 0.5  Alkaline Phos 38 - 126 U/L 120  AST 15 - 41 U/L 109(H)  ALT 0 - 44 U/L 39   Edinburgh Score: Edinburgh Postnatal Depression Scale Screening Tool 10/17/2021  I have been able to laugh and see the funny side of things. 0  I have looked forward with enjoyment to things. 0  I have blamed myself unnecessarily when things went wrong. 1  I have been anxious or worried for no good reason. 0  I have felt scared or panicky for no good reason. 1  Things have been getting on top of me. 1  I have been so unhappy that I have had difficulty sleeping. 0  I have felt sad or miserable. 0  I have been so unhappy that I have been crying. 1  The thought of harming myself has occurred to me. 0  Edinburgh Postnatal Depression Scale Total 4     After visit meds:  Allergies as of 10/17/2021   No Known Allergies      Medication List     STOP taking these medications    ascorbic acid 500 MG tablet Commonly known as: VITAMIN C   Blood Pressure Kit Devi   Gojji Weight Scale Misc   ondansetron 4 MG disintegrating tablet Commonly known as: Zofran ODT       TAKE these medications    amoxicillin-clavulanate 875-125 MG tablet Commonly known as: AUGMENTIN Take 1 tablet by mouth every 12 (twelve) hours for 11 doses.   ferrous sulfate 325 (65 FE) MG EC tablet Take 1 tablet (325 mg total) by mouth every other day.   multivitamin-prenatal 27-0.8 MG Tabs tablet Take 1 tablet by mouth daily at 12 noon.   NIFEdipine 30 MG 24 hr tablet Commonly known as: ADALAT CC Take 1 tablet (30 mg total) by mouth daily.   oxyCODONE 5 MG immediate release tablet Commonly known as: Oxy IR/ROXICODONE Take 1-2 tablets (5-10 mg total) by mouth every 4 (four) hours as needed for moderate pain.          Discharge home in stable condition Infant Feeding: Bottle Infant Disposition:home with mother Discharge instruction: per After Visit Summary and Postpartum booklet. Activity: Advance as tolerated. Pelvic rest for 6 weeks.  Diet: routine diet Future Appointments: Future Appointments  Date Time Provider [place]  10/24/2021  9:55 AM Chancy Milroy, MD [place] None  11/26/2021 10:15 AM Leftwich-Kirby, Kathie Dike, CNM [facility] None   Follow up Visit:  Follow-up [place] Follow up in 1 week(s).   Specialty: Obstetrics and Gynecology  Why: wound check Contact information: [address] [place] [phone]               Message sent to [facility] by Dr. Gwenlyn Perking on 1/11.   Please schedule this patient for a In person postpartum visit in 6 weeks with the following provider: MD. Additional Postpartum F/U: Incision check 1 week  High risk pregnancy complicated by:  Pyelonephritis and chorioamnionitis Delivery mode:  C-Section, Low Transverse  Anticipated Birth Control:  Unsure  10/17/2021 Emeterio Reeve, MD

## 2021-10-15 NOTE — Sepsis Progress Note (Addendum)
Notified bedside nurse of need to request provider at bedside to order lactic acid and blood cultures.  ADD: Secure chat with bedside RN Morrie Sheldon to request that she speak with bedside providers about orders for blood cultures and lactic acid. She stated that PCCM is consulted, and she will try to get these orders from them, as bedside OB MD refused to order these.

## 2021-10-15 NOTE — Transfer of Care (Signed)
Immediate Anesthesia Transfer of Care Note  Patient: Dawn Small  Procedure(s) Performed: CESAREAN SECTION  Patient Location: PACU  Anesthesia Type:Epidural  Level of Consciousness: awake and alert   Airway & Oxygen Therapy: Patient Spontanous Breathing  Post-op Assessment: Report given to RN  Post vital signs: Reviewed  Last Vitals:  Vitals Value Taken Time  BP    Temp    Pulse 144 10/15/21 0842  Resp 27 10/15/21 0842  SpO2 94 % 10/15/21 0842  Vitals shown include unvalidated device data.  Last Pain:  Vitals:   10/15/21 4166  TempSrc: Oral  PainSc:       Patients Stated Pain Goal: 4 (10/10/21 1940)  Complications: No notable events documented.

## 2021-10-15 NOTE — Progress Notes (Signed)
Subjective: Postpartum Day 0: Cesarean Delivery Patient in PACU postop with hypotension fever.    Objective: Vital signs in last 24 hours: Temp:  [96.8 F (36 C)-103.7 F (39.8 C)] 101.9 F (38.8 C) (01/11 1120) Pulse Rate:  [87-156] 120 (01/11 1127) Resp:  [15-34] 31 (01/11 1127) BP: (63-148)/(27-94) 78/46 (01/11 1115) SpO2:  [85 %-99 %] 98 % (01/11 1127) Weight:  [80.8 kg] 80.8 kg (01/10 2138)  Physical Exam:  General: alert, cooperative, and no distress Lochia: appropriate Uterine Fundus: firm Incision: no significant drainage DVT Evaluation: No evidence of DVT seen on physical exam.  Recent Labs    10/14/21 0417 10/15/21 0710  HGB 7.6* 8.9*  HCT 26.5* 33.1*   Intake/Output Summary (Last 24 hours) at 10/15/2021 1134 Last data filed at 10/15/2021 1015 Gross per 24 hour  Intake 2678.56 ml  Output 2505 ml  Net 173.56 ml   S/p 2 units PRBC and albumin  Assessment/Plan: Status post Cesarean section. Postoperative course complicated by fever, hypotension with RBL >1500, on antibiotic, needs critical care management at this time and MD consulted for ICU admission   .  Scheryl Darter 10/15/2021, 11:33 AM

## 2021-10-15 NOTE — Progress Notes (Signed)
Labor Progress Note Dawn Small is a 25 y.o. [id number] at [redacted]w[redacted]d who presented for IOL due to persistent fevers in the setting of bilateral pyelonephritis with suspected new onset chorioamnionitis.   S: Doing well. Comfortable after epidural placement. No concerns at this time.   O:  BP 104/61    Pulse 97    Temp (!) 101.5 F (38.6 C) (Axillary)    Resp 17    Ht 5\' 3"  (1.6 m)    Wt 80.8 kg    LMP 01/15/2021 (Approximate)    SpO2 96%    BMI 31.55 kg/m   EFM: Baseline 170 bpm, min-mod variability, no accels, no decels  Toco: Contractions every 2-3 minutes   CVE: Dilation: 3 Effacement (%): 60 Station: -3 Presentation: Vertex (by ultrasound) Exam by:: E Chipps RN   A&P: 26 y.o. 30 100w0d   #Labor: Progressing well s/p Cytotec and foley balloon placement. Will start Pitocin 2x2 and reassess in 4 hours.  #Pain: Epidural  #FWB: Cat 2 currently due to fetal tachycardia in the setting of current fever. Cat 1 when not febrile. Tylenol given. Will closely monitor to assess for improvement after treatment.  #GBS positive   #Persistent fever of unclear origin #Bilateral pyelonephritis  #Suspected triple I - Patient developed another fever at 2301. Tylenol given.  - Will continue Amp/Gent   2302, MD 12:33 AM

## 2021-10-15 NOTE — Op Note (Signed)
Dawn Small PROCEDURE DATE: 10/05/2021 - 10/15/2021  PREOPERATIVE DIAGNOSIS: Intrauterine pregnancy at  [redacted]w[redacted]d weeks gestation; non-reassuring fetal status and and presumed chorioamnionitis  POSTOPERATIVE DIAGNOSIS: The same  PROCEDURE:     Cesarean Section  SURGEON:  Dr. Mora Bellman  ASSISTANT: Dr. Gwenlyn Perking  INDICATIONS: Dawn Small is a 26 y.o. [id number] at [redacted]w[redacted]d scheduled for cesarean section secondary to chorioamnionitis and non-reassuring fetal status.  The risks of cesarean section discussed with the patient included but were not limited to: bleeding which may require transfusion or reoperation; infection which may require antibiotics; injury to bowel, bladder, ureters or other surrounding organs; injury to the fetus; need for additional procedures including hysterectomy in the event of a life-threatening hemorrhage; placental abnormalities wth subsequent pregnancies, incisional problems, thromboembolic phenomenon and other postoperative/anesthesia complications. The patient concurred with the proposed plan, giving informed written consent for the procedure.    FINDINGS:  Viable female infant in cephalic presentation.  Apgars 7 and 9.  Thick meconium stained amniotic fluid.  Intact placenta, three vessel cord.  Normal uterus, fallopian tubes and ovaries bilaterally.  ANESTHESIA:    Spinal INTRAVENOUS FLUIDS:700 ml and 2 units pRBC ESTIMATED BLOOD LOSS: 11997 ml URINE OUTPUT:  100 ml SPECIMENS: Placenta sent to pathology COMPLICATIONS: None immediate  PROCEDURE IN DETAIL:  The patient received intravenous antibiotics and had sequential compression devices applied to her lower extremities while in the preoperative area.  She was then taken to the operating room where anesthesia was induced and was found to be adequate. She was then placed in a dorsal supine position with a leftward tilt, and prepped and draped in a sterile manner. After an adequate timeout was performed, a Pfannenstiel  skin incision was made with scalpel and carried through to the underlying layer of fascia. The fascia was incised in the midline and this incision was extended bilaterally using the Mayo scissors. Kocher clamps were applied to the superior aspect of the fascial incision and the underlying rectus muscles were dissected off bluntly. A similar process was carried out on the inferior aspect of the facial incision. The rectus muscles were separated in the midline bluntly and the peritoneum was entered bluntly. The Alexis self-retaining retractor was introduced into the abdominal cavity. Attention was turned to the lower uterine segment where a bladder flap was created, and a transverse hysterotomy was made with a scalpel and extended bilaterally bluntly. The infant was successfully delivered, and delayed cord clamping was performed for 1 minute. The cord was clamped and cut and infant was handed over to awaiting neonatology team. Uterine massage was then administered and the placenta delivered intact with three-vessel cord. The uterus was cleared of clot and debris.  The hysterotomy was closed with 0 Vicryl in a running locked fashion, and an imbricating layer was also placed with a 0 Vicryl. Overall, excellent hemostasis was noted. The pelvis copiously irrigated and cleared of all clot and debris. Hemostasis was confirmed on all surfaces.  The peritoneum and the muscles were reapproximated using 0 vicryl interrupted stitches. The fascia was then closed using 0 Vicryl in a running fashion.  The skin was closed in a subcuticular fashion using 3.0 Vicryl. The patient tolerated the procedure well. Sponge, lap, instrument and needle counts were correct x 2. She was taken to the recovery room in stable condition.    Adlai Nieblas ConstantMD  10/15/2021 8:06 AM

## 2021-10-15 NOTE — Sepsis Progress Note (Signed)
eLink is following this Code Sepsis. °

## 2021-10-15 NOTE — Progress Notes (Signed)
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 WBC 18.7. Patient had C section early this morning for non reassuring fetal status and presumed choriamnionitis    Assessment Shock- Likely hemorrhagic in the setting of acute blood loss s/p C section, possible septic  E Coli complicated UTI/bilateral pyelonephritis Fevers - concerns for intra-amnionitic infection before delivery Transaminitis - in the setting of shock    Recommendations Fu blood cultures  Will switch ampicillin and gentamicin to Unasyn for better anaerobic coverage now she is post C -section  Monitor Fever curve and WBC count  Transfusion and Fluid/electrolyte management per PCCM Following   Rest of the management as per the primary team. Thank you for the consult. Please page with pertinent questions or concerns. ______________________________________________________________________ Subjective patient seen and examined at the bedside post C section in the MICU  Boyfriend and Father in law at bedside.  Complains  of fever but no other symptoms  Vitals BP 109/76    Pulse 85    Temp 99.3 F (37.4 C) (Oral)    Resp 18    Ht 5\' 3"  (1.6 m)    Wt 80.8 kg    LMP 01/15/2021 (Approximate)    SpO2 98%    Breastfeeding Unknown    BMI 31.55 kg/m     Physical Exam Constitutional:  sitting up in the bed     Comments:   Cardiovascular:     Rate and Rhythm: Normal rate and regular rhythm.     Heart sounds:   Pulmonary:     Effort: Pulmonary effort is normal.     Comments:   Abdominal:  Post surgical dressing   Musculoskeletal:        General: swelling of bilateral lower extremities   Skin:    Comments: No lesions or rashes   Neurological:     General: No focal deficit present.   Psychiatric:        Mood and Affect: Mood normal. Calm and cooperative    Pertinent Microbiology Results for orders placed or performed during the hospital encounter of 10/05/21  OB Urine Culture     Status: Abnormal  Collection Time: 10/05/21  8:41 PM   Specimen: OB Clean Catch; Urine  Result Value Ref Range Status   Specimen Description OB CLEAN CATCH  Final   Special Requests NONE  Final   Culture (A)  Final    >=100,000 COLONIES/mL ESCHERICHIA COLI NO GROUP B STREP (S.AGALACTIAE) ISOLATED Performed at [facility], [place] [address], [place], [place] [address]    Report Status 10/08/2021 FINAL  Final   Organism ID, Bacteria ESCHERICHIA COLI (A)  Final      Susceptibility   Escherichia coli - MIC*    AMPICILLIN <=2 SENSITIVE Sensitive     CEFAZOLIN <=4 SENSITIVE Sensitive     CEFEPIME <=0.12 SENSITIVE Sensitive     CEFTAZIDIME <=1 SENSITIVE Sensitive     CEFTRIAXONE <=0.25 SENSITIVE Sensitive     CIPROFLOXACIN <=0.25 SENSITIVE Sensitive     GENTAMICIN <=1 SENSITIVE Sensitive     IMIPENEM <=0.25 SENSITIVE Sensitive     TRIMETH/SULFA <=20 SENSITIVE Sensitive     AMPICILLIN/SULBACTAM <=2 SENSITIVE Sensitive     PIP/TAZO <=4 SENSITIVE Sensitive     * >=100,000 COLONIES/mL ESCHERICHIA COLI  Urine  Culture     Status: None   Collection Time: 10/10/21 12:14 PM   Specimen: Urine, Clean Catch  Result Value Ref Range Status   Specimen Description URINE, CLEAN CATCH  Final   Special Requests NONE  Final   Culture   Final    NO GROWTH Performed at [facility], [place] [address], [place], [place] [address]    Report Status 10/12/2021 FINAL  Final  Respiratory (~20 pathogens) panel by PCR     Status: None   Collection Time: 10/11/21  9:46 AM   Specimen: Nasopharyngeal Swab; Respiratory  Result Value Ref Range Status   Adenovirus NOT DETECTED NOT DETECTED Final   Coronavirus 229E NOT DETECTED NOT DETECTED Final    Comment: (NOTE) The Coronavirus on the Respiratory Panel, DOES NOT test for the novel  Coronavirus (2019 nCoV)    Coronavirus HKU1 NOT DETECTED NOT DETECTED Final   Coronavirus NL63 NOT DETECTED NOT DETECTED Final   Coronavirus OC43 NOT DETECTED NOT DETECTED Final   Metapneumovirus NOT DETECTED NOT DETECTED Final   Rhinovirus / Enterovirus NOT DETECTED NOT DETECTED Final   Influenza A NOT DETECTED NOT DETECTED Final   Influenza B NOT DETECTED NOT DETECTED Final   Parainfluenza Virus 1 NOT DETECTED NOT DETECTED Final   Parainfluenza Virus 2 NOT DETECTED NOT DETECTED Final   Parainfluenza Virus 3 NOT DETECTED NOT DETECTED Final   Parainfluenza Virus 4 NOT DETECTED NOT DETECTED Final   Respiratory Syncytial Virus NOT DETECTED NOT DETECTED Final   Bordetella pertussis NOT DETECTED NOT DETECTED Final   Bordetella Parapertussis NOT DETECTED NOT DETECTED Final   Chlamydophila pneumoniae NOT DETECTED NOT DETECTED Final   Mycoplasma pneumoniae NOT DETECTED NOT DETECTED Final    Comment: Performed at [facility], [place]. [address], [place], [place] [address]  Resp Panel by RT-PCR (Flu A&B, Covid) Nasopharyngeal Swab     Status: None   Collection Time: 10/13/21 10:55 AM   Specimen: Nasopharyngeal Swab; Nasopharyngeal(NP) swabs in vial transport medium  Result Value Ref  Range Status   SARS Coronavirus 2 by RT PCR NEGATIVE NEGATIVE Final    Comment: (NOTE) SARS-CoV-2 target nucleic acids are NOT DETECTED.  The SARS-CoV-2 RNA is generally detectable in upper respiratory specimens during the acute phase of infection. The lowest concentration of SARS-CoV-2 viral copies this assay  can detect is 138 copies/mL. A negative result does not preclude SARS-Cov-2 infection and should not be used as the sole basis for treatment or other patient management decisions. A negative result may occur with  improper specimen collection/handling, submission of specimen other than nasopharyngeal swab, presence of viral mutation(s) within the areas targeted by this assay, and inadequate number of viral copies(<138 copies/mL). A negative result must be combined with clinical observations, patient history, and epidemiological information. The expected result is Negative.  Fact Sheet for Patients:  EntrepreneurPulse.com.au  Fact Sheet for Healthcare Providers:  IncredibleEmployment.be  This test is no t yet approved or cleared by the [place] FDA and  has been authorized for detection and/or diagnosis of SARS-CoV-2 by FDA under an Emergency Use Authorization (EUA). This EUA will remain  in effect (meaning this test can be used) for the duration of the COVID-19 declaration under Section 564(b)(1) of the Act, 21 U.S.C.section 360bbb-3(b)(1), unless the authorization is terminated  or revoked sooner.       Influenza A by PCR NEGATIVE NEGATIVE Final   Influenza B by PCR NEGATIVE NEGATIVE Final    Comment: (NOTE) The Xpert Xpress SARS-CoV-2/FLU/RSV plus assay is intended as an aid in the diagnosis of influenza from Nasopharyngeal swab specimens and should not be used as a sole basis for treatment. Nasal washings and aspirates are unacceptable for Xpert Xpress SARS-CoV-2/FLU/RSV testing.  Fact Sheet for  Patients: EntrepreneurPulse.com.au  Fact Sheet for Healthcare Providers: IncredibleEmployment.be  This test is not yet approved or cleared by the [place] FDA and has been authorized for detection and/or diagnosis of SARS-CoV-2 by FDA under an Emergency Use Authorization (EUA). This EUA will remain in effect (meaning this test can be used) for the duration of the COVID-19 declaration under Section 564(b)(1) of the Act, 21 U.S.C. section 360bbb-3(b)(1), unless the authorization is terminated or revoked.  Performed at [facility], [place] [address] [place], [place] [address]   Culture, blood (routine x 2)     Status: None (Preliminary result)   Collection Time: 10/14/21 10:44 AM   Specimen: BLOOD  Result Value Ref Range Status   Specimen Description BLOOD RIGHT ANTECUBITAL  Final   Special Requests   Final    BOTTLES DRAWN AEROBIC AND ANAEROBIC Blood Culture adequate volume   Culture   Final    NO GROWTH 1 DAY Performed at [facility], [place] [address] [place], [place] [address]    Report Status PENDING  Incomplete  Culture, blood (routine x 2)     Status: None (Preliminary result)   Collection Time: 10/14/21 10:47 AM   Specimen: BLOOD  Result Value Ref Range Status   Specimen Description BLOOD LEFT ANTECUBITAL  Final   Special Requests   Final    BOTTLES DRAWN AEROBIC AND ANAEROBIC Blood Culture adequate volume   Culture   Final    NO GROWTH 1 DAY Performed at [facility], [place] [address], [place],  [address]    Report Status PENDING  Incomplete    Pertinent Lab. CBC Latest Ref Rng & Units 10/15/2021 10/15/2021 10/14/2021  WBC 4.0 - 10.5 K/uL 16.1(H) 18.7(H) 8.9  Hemoglobin 12.0 - 15.0 g/dL 5.4(LL) 8.9(L) 7.6(L)  Hematocrit 36.0 - 46.0 % 17.4(L) 33.1(L) 26.5(L)  Platelets 150 - 400 K/uL 146(L) 302 247   CMP Latest Ref Rng & Units 10/14/2021 10/12/2021 10/12/2021  Glucose 70 - 99 mg/dL 81 75 80  BUN 6 - 20 mg/dL  <5(L) <  5(L) <5(L)  Creatinine 0.44 - 1.00 mg/dL 1.01(H) 0.92 0.95  Sodium 135 - 145 mmol/L 134(L) 134(L) 134(L)  Potassium 3.5 - 5.1 mmol/L 3.3(L) 3.7 2.9(L)  Chloride 98 - 111 mmol/L 101 100 100  CO2 22 - 32 mmol/L 24 24 24   Calcium 8.9 - 10.3 mg/dL 7.0(L) 7.7(L) 7.6(L)  Total Protein 6.5 - 8.1 g/dL 5.1(L) - 5.1(L)  Total Bilirubin 0.3 - 1.2 mg/dL 0.9 - 0.9  Alkaline Phos 38 - 126 U/L 114 - 95  AST 15 - 41 U/L 379(H) - 108(H)  ALT 0 - 44 U/L 83(H) - 34    Pertinent Imaging today Plain films and CT images have been personally visualized and interpreted; radiology reports have been reviewed. Decision making incorporated into the Impression / Recommendations.  MR ABDOMEN WO CONTRAST  Result Date: 10/11/2021 CLINICAL DATA:  Pyelonephritis. EXAM: MRI ABDOMEN WITHOUT CONTRAST TECHNIQUE: Multiplanar multisequence MR imaging was performed without the administration of intravenous contrast. COMPARISON:  Renal sonogram 10/10/2021 FINDINGS: Lower chest: Small bilateral pleural effusions. Interlobular septal thickening identified concerning for mild interstitial edema. Hepatobiliary: Small cystic lesion within the lateral right hepatic lobe measures 6 mm, image 17/8. Partially collapsed gallbladder is unremarkable. No signs of bile duct dilatation. Pancreas: No mass, inflammatory changes, or other parenchymal abnormality identified. Spleen:  Within normal limits in size and appearance. Adrenals/Urinary Tract:  The adrenal glands are normal. Mild bilateral hydronephrosis identified. On the diffusion-weighted sequences there are multiple peripheral segmental cortical areas of increased signal within the right kidney compatible with pyelonephritis, image 99/10, image 105/10 and image 109/10. This is compatible with pyelonephritis. Single segmental area of peripheral increased signal on the diffusion-weighted sequence noted within the posterior cortex of the interpolar left kidney. Also compatible with  pyelonephritis. No signs of renal abscess identified bilaterally. Stomach/Bowel: No dilated bowel loops identified. Vascular/Lymphatic: Visualized portions within the abdomen are unremarkable. Other: Diffuse body wall edema is identified. Trace perihepatic ascites. No fluid collections identified. Gravid uterus is identified consistent with [redacted] week gestation Musculoskeletal: No suspicious bone lesions identified. IMPRESSION: 1. Imaging findings compatible with bilateral pyelonephritis, right greater than left. No signs of renal abscess. 2. Mild bilateral hydronephrosis. 3. Gravid uterus consistent with [redacted] week gestation. 4. Small pleural effusions and mild interstitial pulmonary edema. There is also diffuse body wall edema. Electronically Signed   By: Kerby Moors M.D.   On: 10/11/2021 11:51   US RENAL  Result Date: 10/10/2021 CLINICAL DATA:  Pyelonephritis.  Third trimester pregnancy. EXAM: RENAL / URINARY TRACT ULTRASOUND COMPLETE COMPARISON:  Renal ultrasound 10/07/2021 FINDINGS: Right Kidney: Renal measurements: 11.7 x 6.1 x 3.4 cm = volume: 124 mL. Echogenicity within normal limits. No mass identified. There is mild-to-moderate right hydronephrosis, changed from prior. Left Kidney: Renal measurements: 11.1 x 5.9 x 3.0 cm = volume: 103 mL. Echogenicity within normal limits. No mass identified. Bladder: Appears normal for degree of bladder distention. Prevoid urinary bladder volume: 74 mL. Patient reported she did not have urge to urinate and no postvoid measurement was performed. Other: None. IMPRESSION: Bilateral mild-to-moderate hydronephrosis, mildly greater on the right. This is not significantly changed from 10/07/2021. This again is compatible with maternal hydronephrosis in pregnancy. Electronically Signed   By: Yvonne Kendall   On: 10/10/2021 14:01   US RENAL  Result Date: 10/07/2021 CLINICAL DATA:  Urinary tract infection. Third trimester pregnancy. Back pain. EXAM: RENAL / URINARY TRACT  ULTRASOUND COMPLETE COMPARISON:  None. FINDINGS: Right Kidney: Renal measurements: 11.4 x 5.3 x 4.7 cm =  volume: 148 mL. Echogenicity is within normal limits. Moderate hydrocephalus is present. No focal mass lesion or stone is present. Left Kidney: Renal measurements: 11.1 x 5.2 x 4.5 cm = volume: 135 mL. Echogenicity is within normal limits. Moderate hydrocephalus is present. No focal mass lesion or stone is present. Bladder: Appears normal for degree of bladder distention. Other: None. IMPRESSION: 1. Bilateral hydronephrosis, likely related to pregnancy. 2. No focal parenchymal abnormality or mass lesion. Electronically Signed   By: San Morelle M.D.   On: 10/07/2021 10:02   VAS [place] LOWER EXTREMITY VENOUS (DVT)  Result Date: 10/13/2021  Lower Venous DVT Study Patient Name:  Vanette Cuaresma  Date of Exam:   10/12/2021 Medical Rec #: [id number]       Accession #:    [id number] Date of Birth: 14-Aug-1996       Patient Gender: F Patient Age:   43 years Exam Location:  [facility] Procedure:      VAS [place] LOWER EXTREMITY VENOUS (DVT) Referring Phys: CORNELIUS VAN DAM --------------------------------------------------------------------------------  Indications: Fever of unknown origin. [redacted] weeks pregnant.  Limitations: Body habitus and Significant lower extremity edema. Pregnancy. Comparison Study: No prior study Performing Technologist: Sharion Dove RVS  Examination Guidelines: A complete evaluation includes B-mode imaging, spectral Doppler, color Doppler, and power Doppler as needed of all accessible portions of each vessel. Bilateral testing is considered an integral part of a complete examination. Limited examinations for reoccurring indications may be performed as noted. The reflux portion of the exam is performed with the patient in reverse Trendelenburg.  +---------+---------------+---------+-----------+----------+--------------+  RIGHT     Compressibility Phasicity Spontaneity Properties Thrombus Aging   +---------+---------------+---------+-----------+----------+--------------+  CFV       Full            Yes       Yes                                    +---------+---------------+---------+-----------+----------+--------------+  SFJ       Full                                                             +---------+---------------+---------+-----------+----------+--------------+  FV Prox   Full                                                             +---------+---------------+---------+-----------+----------+--------------+  FV Mid    Full                                                             +---------+---------------+---------+-----------+----------+--------------+  FV Distal Full                                                             +---------+---------------+---------+-----------+----------+--------------+  PFV       Full                                                             +---------+---------------+---------+-----------+----------+--------------+  POP       Full            Yes       Yes                                    +---------+---------------+---------+-----------+----------+--------------+  PTV       Full                                                             +---------+---------------+---------+-----------+----------+--------------+  PERO      Full                                                             +---------+---------------+---------+-----------+----------+--------------+   +---------+---------------+---------+-----------+----------+-------------------+  LEFT      Compressibility Phasicity Spontaneity Properties Thrombus Aging       +---------+---------------+---------+-----------+----------+-------------------+  CFV       Full            Yes       No                                          +---------+---------------+---------+-----------+----------+-------------------+  SFJ       Full                                                                   +---------+---------------+---------+-----------+----------+-------------------+  FV Prox   Full                                                                  +---------+---------------+---------+-----------+----------+-------------------+  FV Mid                                                     Not well visualized  +---------+---------------+---------+-----------+----------+-------------------+  FV Distal                 Yes       Yes  patent by color and                                                              Doppler              +---------+---------------+---------+-----------+----------+-------------------+  PFV                       Yes       Yes                    patent by color and                                                              Doppler              +---------+---------------+---------+-----------+----------+-------------------+  POP       Full            Yes       Yes                                         +---------+---------------+---------+-----------+----------+-------------------+  PTV       Full                                                                  +---------+---------------+---------+-----------+----------+-------------------+  PERO      Full                                                                  +---------+---------------+---------+-----------+----------+-------------------+     Summary: BILATERAL: -No evidence of popliteal cyst, bilaterally. RIGHT: - There is no evidence of deep vein thrombosis in the lower extremity. However, portions of this examination were limited- see technologist comments above.  LEFT: - There is no evidence of deep vein thrombosis in the lower extremity. However, portions of this examination were limited- see technologist comments above.  - Ultrasound characteristics of enlarged lymph nodes noted in the groin.  *See table(s) above for measurements and observations. Electronically signed by Lemar LivingsBrandon Cain MD on  10/13/2021 at 3:13:06 PM.    Final    VAS US UPPER EXTREMITY VENOUS DUPLEX  Result Date: 10/13/2021 UPPER VENOUS STUDY  Patient Name:  Marney Settingdrienne Hofacker  Date of Exam:   10/12/2021 Medical Rec #: [id number]       Accession #:    [id number] Date of Birth: 15-Apr-1996       Patient Gender: F Patient Age:   89 years [facility] Procedure:  VAS [place] UPPER EXTREMITY VENOUS DUPLEX Referring Phys: --------------------------------------------------------------------------------  Indications: Fever of unknown origin, [redacted] weeks pregnant Limitations: Line. Comparison Study: No prior study Performing Technologist: Sharion Dove RVS  Examination Guidelines: A complete evaluation includes B-mode imaging, spectral Doppler, color Doppler, and power Doppler as needed of all accessible portions of each vessel. Bilateral testing is considered an integral part of a complete examination. Limited examinations for reoccurring indications may be performed as noted.  Right Findings: +----------+------------+---------+-----------+----------+--------------------+  RIGHT      Compressible Phasicity Spontaneous Properties       Summary         +----------+------------+---------+-----------+----------+--------------------+  IJV            Full        Yes        Yes                                      +----------+------------+---------+-----------+----------+--------------------+  Subclavian                 Yes        Yes                                      +----------+------------+---------+-----------+----------+--------------------+  Axillary                   Yes        Yes                                      +----------+------------+---------+-----------+----------+--------------------+  Brachial       Full                                                            +----------+------------+---------+-----------+----------+--------------------+  Radial         Full                                                             +----------+------------+---------+-----------+----------+--------------------+  Ulnar                                                      patent by color     +----------+------------+---------+-----------+----------+--------------------+  Cephalic       Full                                                            +----------+------------+---------+-----------+----------+--------------------+  Basilic        None  Small segment of                                                                   acute SVT        +----------+------------+---------+-----------+----------+--------------------+  Left Findings: +----------+------------+---------+-----------+----------+--------------+  LEFT       Compressible Phasicity Spontaneous Properties    Summary      +----------+------------+---------+-----------+----------+--------------+  IJV            Full        Yes        Yes                                +----------+------------+---------+-----------+----------+--------------+  Subclavian                 Yes        Yes                                +----------+------------+---------+-----------+----------+--------------+  Axillary                   Yes        Yes                                +----------+------------+---------+-----------+----------+--------------+  Brachial       Full                                                      +----------+------------+---------+-----------+----------+--------------+  Radial         Full                                                      +----------+------------+---------+-----------+----------+--------------+  Ulnar                                                    Not visualized  +----------+------------+---------+-----------+----------+--------------+  Cephalic       Full                                                      +----------+------------+---------+-----------+----------+--------------+  Basilic        Full                                                       +----------+------------+---------+-----------+----------+--------------+  Summary:  Right: No evidence of deep vein thrombosis in the upper extremity. Findings consistent with acute superficial vein thrombosis involving the right basilic vein.  Left: No evidence of deep vein thrombosis in the upper extremity. No evidence of superficial vein thrombosis in the upper extremity.  *See table(s) above for measurements and observations.  Diagnosing physician: Servando Snare MD Electronically signed by Servando Snare MD on 10/13/2021 at 5:53:37 PM.    Final    [place] MFM OB FOLLOW UP  Result Date: 10/14/2021 ----------------------------------------------------------------------  OBSTETRICS REPORT                       (Signed Final 10/14/2021 08:25 am) ---------------------------------------------------------------------- Patient Info  ID #:       [id number]                          D.O.B.:  1996-01-24 (25 yrs)  Name:       Batsheva Cleere                  Visit Date: 10/13/2021 12:21 pm ---------------------------------------------------------------------- Performed By  Attending:        Sander Nephew      Ref. Address:     [place], [place]  Performed By:     Valda Favia          [facility]                    [place]  Referred By:      Woodroe Mode                    MD ---------------------------------------------------------------------- Orders  #  Description                           Code        Ordered By  1  [place] MFM OB FOLLOW UP  [id number]    JAMES ARNOLD ----------------------------------------------------------------------  #  Order #                     Accession #                 Episode #  1  [id number]                   [id number]                 [id number] ---------------------------------------------------------------------- Indications  [redacted] weeks gestation of pregnancy                Z3A.38  Poor obstetric history-Recurrent (habitual)    O26.20  abortion (4 consecutive iab's)  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 Cord Insertion:      Previously Visualized  Amniotic Fluid  AFI FV:      Within normal limits  AFI Sum(cm)     %Tile       Largest Pocket(cm)  15.3            61          5  RUQ(cm)       RLQ(cm)       LUQ(cm)        LLQ(cm)  4.5           3             5               2.8 ---------------------------------------------------------------------- Biometry  BPD:        93  mm     G. Age:  37w 6d         56  %    CI:        80.14   %    70 - 86                                                          FL/HC:      22.7   %    20.6 - 23.4  HC:      328.2  mm     G. Age:  37w 2d         10  %    HC/AC:      0.94        0.87 - 1.06  AC:      347.7  mm     G. Age:  38w 5d         70  %    FL/BPD:     80.1   %    71 - 87  FL:       74.5  mm     G. Age:  38w 1d  40  %    FL/AC:      21.4   %    20 - 24  Est. FW:    3448  gm    7 lb 10 oz      56  % ---------------------------------------------------------------------- OB History  Blood Type:   A+  Gravidity:    5  TOP:          4        Living:  0 ---------------------------------------------------------------------- Gestational Age  LMP:           38w 5d        Date:  01/15/21                 EDD:   10/22/21  U/S Today:     38w 0d                                        EDD:   10/27/21  Best:          38w 5d     Det. By:  LMP  (01/15/21)          EDD:    10/22/21 ---------------------------------------------------------------------- Anatomy  Cranium:               Appears normal         Aortic Arch:            Previously seen  Cavum:                 Previously seen        Ductal Arch:            Previously seen  Ventricles:            Previously seen        Diaphragm:              Previously seen  Choroid Plexus:        Previously seen        Stomach:                Appears normal, left                                                                        sided  Cerebellum:            Previously seen        Abdomen:                Previously seen  Posterior Fossa:       Previously seen        Abdominal Wall:         Previously seen  Nuchal Fold:           Previously seen        Cord Vessels:           Previously seen  Face:                  Orbits and profile     Kidneys:  Previously seen                         previously seen  Lips:                  Previously seen        Bladder:                Appears normal  Thoracic:              Previously seen        Spine:                  Previously seen  Heart:                 Appears normal         Upper Extremities:      Previously seen                         (4CH, axis, and                         situs)  RVOT:                  Previously seen        Lower Extremities:      Previously seen  LVOT:                  Previously seen  Other:  Fetus appears to be a female. BCV previously visualized. 3VV and          3VTV appear normal previously ---------------------------------------------------------------------- Cervix Uterus Adnexa  Cervix  Not visualized (advanced GA >24wks)  Uterus  No abnormality visualized.  Right Ovary  No adnexal mass visualized.  Left Ovary  No adnexal mass visualized.  Cul De Sac  No free fluid seen.  Adnexa  No abnormality visualized. ---------------------------------------------------------------------- Impression  Follow up growth due advanced maternal age and inpatient   admission.  Normal interval growth with measurements consistent with  dates  Good fetal movement and amniotic fluid volume ---------------------------------------------------------------------- Recommendations  Follow up as clinically indicated. ----------------------------------------------------------------------               Sander Nephew, MD Electronically Signed Final Report   10/14/2021 08:25 am ----------------------------------------------------------------------   I spent more than 45 minutes for this patient encounter including review of prior medical records, coordination of care with primary/other specialist with greater than 50% of time being face to face/counseling and discussing diagnostics/treatment plan with the patient/family.  Electronically signed by:   Rosiland Oz, MD Infectious Disease Physician [facility] Pager: [phone]

## 2021-10-15 NOTE — Consult Note (Signed)
NAME:  Dawn Small, MRN:  [id number], DOB:  Jun 11, 1996, LOS: 60 ADMISSION DATE:  10/05/2021, CONSULTATION DATE: 10/15/2021 REFERRING MD: Cyndie Mull, CHIEF COMPLAINT: Shock status post C-section  History of Present Illness:  26 year old female who is on her fifth pregnancy with delivery of viable female child 10/15/2021 0730 hrs.  She has a chronic pyelonephritis is being followed by infectious disease and is on current antimicrobial therapy.  Blood loss was estimated at approximately 1200 cc was in the process of getting 3 units of packed cells, status post 2 L of crystalloids, status post albumin and appears to still need fluid resuscitation.  She is noted to be hypotensive and tachycardic and had a fever of 101.3.  Pulmonary critical care was called to the bedside and she will be transferred to intensive care unit for further evaluation and treatment.  Pertinent  Medical History   Past Medical History:  Diagnosis Date   Asthma      Significant Hospital Events: Including procedures, antibiotic start and stop dates in addition to other pertinent events   10/15/2021 0730 hrs. status post  C-section with delivery of viable female child but with extensive bleeding with estimated blood loss of 1200 cc  Interim History / Subjective:  Status post C-section with estimated blood loss 1200 cc hypertensive postop  Objective   Blood pressure (!) 91/54, pulse (!) 116, temperature 99.4 F (37.4 C), temperature source Oral, resp. rate 19, height _0  (1.6 m), weight 80.8 kg, last menstrual period 01/15/2021, SpO2 99 %, unknown if currently breastfeeding.        Intake/Output Summary (Last 24 hours) at 10/15/2021 1209 Last data filed at 10/15/2021 1015 Gross per 24 hour  Intake 2678.56 ml  Output 2505 ml  Net 173.56 ml   Filed Weights   10/05/21 2005 10/14/21 2138  Weight: 80.8 kg 80.8 kg    Examination: General: Well-nourished well-developed female in no acute distress at rest HENT: No JVD or  lymphadenopathy is appreciated Lungs: Clear to auscultation Cardiovascular: Heart sounds are regular Abdomen: C-section dressings well approximated, currently 437 blood loss from vaginal discharge Extremities: Warm and dry Neuro: Tired but neurologically intact GU: Catheter with dirty urine  Resolved Hospital Problem list     Assessment & Plan:  Shock in the setting of status post C-section 0730 hrs. 10/15/2021 with an estimated blood loss of 1200 cc and with the pre-existing pyelonephritis being treated by infectious disease with antimicrobial therapy.  Noted to have elevated fever, tachycardia, and hypotension with systolic blood pressure approximately 84.  Pulmonary critical care called to the labor and delivery postop area for further evaluation and treatment. Continue fluid resuscitation Continue total 3 units of packed cell Serial CBCs Agree with crystalloids Agree with colloids Transfer the intensive care unit for further evaluation and treatment Serial CBCs to maintain adequate hemoglobin greater than 7 She may need vasopressor support as noted to have poor vessels therefore may require central line. Monitor fundus and bleeding from C-section site   5 gravida 1 live birth 10/15/2021 Per OB  Pyelonephritis chronic being followed by infectious disease. Antibiotics per ID  Best Practice (right click and "Reselect all SmartList Selections" daily)   Diet/type: Regular consistency (see orders) DVT prophylaxis: not indicated GI prophylaxis: PPI Lines: N/A Foley:  N/A Code Status:  full code Last date of multidisciplinary goals of care discussion [tbd]  Labs   CBC: Recent Labs  Lab 10/09/21 0459 10/11/21 8299 10/12/21 0445 10/12/21 2357 10/14/21 0417 10/15/21 0710 10/15/21 1113  WBC 9.1 11.6* 9.5 9.1 8.9 18.7* 16.1*  NEUTROABS 7.4 7.3 7.2 6.8 7.0  --   --   HGB 7.1* 8.1* 7.9* 8.3* 7.6* 8.9* 5.4*  HCT 25.4* 29.2* 27.9* 29.5* 26.5* 33.1* 17.4*  MCV 64.3* 65.9* 65.5*  65.7* 65.4* 68.2* 73.1*  PLT 198 197 196 222 247 302 146*    Basic Metabolic Panel: Recent Labs  Lab 10/12/21 0445 10/12/21 2357 10/14/21 1133  NA 134* 134* 134*  K 2.9* 3.7 3.3*  CL 100 100 101  CO2 _0 GLUCOSE 80 75 81  BUN <5* <5* <5*  CREATININE 0.95 0.92 1.01*  CALCIUM 7.6* 7.7* 7.0*   GFR: Estimated Creatinine Clearance: 85 mL/min (A) (by C-G formula based on SCr of 1.01 mg/dL (H)). Recent Labs  Lab 10/12/21 2357 10/14/21 0417 10/15/21 0710 10/15/21 1113  WBC 9.1 8.9 18.7* 16.1*    Liver Function Tests: Recent Labs  Lab 10/12/21 0445 10/14/21 1133  AST 108* 379*  ALT 34 83*  ALKPHOS 95 114  BILITOT 0.9 0.9  PROT 5.1* 5.1*  ALBUMIN 1.7* 1.8*   No results for input(s): LIPASE, AMYLASE in the last 168 hours. No results for input(s): AMMONIA in the last 168 hours.  ABG No results found for: PHART, PCO2ART, PO2ART, HCO3, TCO2, ACIDBASEDEF, O2SAT   Coagulation Profile: No results for input(s): INR, PROTIME in the last 168 hours.  Cardiac Enzymes: Recent Labs  Lab 10/12/21 2357  CKTOTAL 106    HbA1C: No results found for: HGBA1C  CBG: No results for input(s): GLUCAP in the last 168 hours.  Review of Systems:   na  Past Medical History:  She,  has a past medical history of Asthma.   Surgical History:   Past Surgical History:  Procedure Laterality Date   WISDOM TOOTH EXTRACTION       Social History:   reports that she has never smoked. She has never used smokeless tobacco. She reports current alcohol use. She reports that she does not use drugs.   Family History:  Her family history includes Cancer in her maternal aunt.   Allergies No Known Allergies   Home Medications  Prior to Admission medications   Medication Sig Start Date End Date Taking? Authorizing Provider  ferrous sulfate 325 (65 FE) MG EC tablet Take 1 tablet (325 mg total) by mouth every other day. 07/29/21  Yes Laury Deep, CNM  Prenatal Vit-Fe Fumarate-FA  (MULTIVITAMIN-PRENATAL) 27-0.8 MG TABS tablet Take 1 tablet by mouth daily at 12 noon.   Yes [provider]  ascorbic acid (VITAMIN C) 500 MG tablet Take 1 tablet (500 mg total) by mouth every other day. Take with iron pill 07/29/21   Laury Deep, CNM  Blood Pressure Monitoring (BLOOD PRESSURE KIT) DEVI 1 kit by Does not apply route once a week. Check Blood Pressure regularly and record readings into the Bab[employer] App.  Large Cuff.  DX O90.0 04/25/21   Chancy Milroy, MD  Misc. Devices (GOJJI WEIGHT SCALE) MISC 1 Device by Does not apply route once a week. 04/25/21   Chancy Milroy, MD  ondansetron (ZOFRAN ODT) 4 MG disintegrating tablet Take 1 tablet (4 mg total) by mouth every 6 (six) hours as needed for nausea. Patient not taking: Reported on 07/25/2021 05/09/21   Seabron Spates, CNM     Critical care time: 56 min   Richardson Landry Khelani Kops ACNP Acute Care Nurse Practitioner [place] Please consult Amion 10/15/2021, 12:10 PM

## 2021-10-15 NOTE — Progress Notes (Signed)
CRITICAL RESULT PROVIDER NOTIFICATION  Test performed and critical result:  Hemoglobin 5.4  Date and time result received:  10/15/21 1158  Provider name/title: Dr. Scheryl Darter  Date and time provider notified: 10/15/21 1158  Date and time provider responded: 10/15/21 1158  Provider response:See new orders

## 2021-10-15 NOTE — Progress Notes (Signed)
Labor Progress Note Dawn Small is a 26 y.o. [id number] at [redacted]w[redacted]d who presented for IOL due to persistent fevers in the setting of bilateral pyelonephritis with suspected new onset chorioamnionitis.   S: Resting. Having chills again. No concerns at this time.   O:  BP 99/73    Pulse (!) 122    Temp (!) 102.1 F (38.9 C) (Axillary)    Resp 17    Ht 5\' 3"  (1.6 m)    Wt 80.8 kg    LMP 01/15/2021 (Approximate)    SpO2 96%    BMI 31.55 kg/m   EFM: Baseline 175 bpm, moderate variability, no accels, variable decels  Toco: Irregular contractions   CVE: Dilation: 3 Effacement (%): 60 Station: -3 Presentation: Vertex (by 01/17/2021) Exam by:: Dr. [email or web address]   A&P: 26 y.o. G5P0040 [redacted]w[redacted]d   #Labor: SVE the same as prior check. AROM performed with meconium stained fluid. IUPC placed. Will restart Pitocin and reassess.  #Pain: Epidural  #FWB: Cat 2 due to fetal tachycardia in the setting of fever and variable decel x1 after AROM. Tylenol and IVF to help with tachycardia due to recurrence of fever again. Will closely monitor fetal tolerance of restarting Pitocin. Discussed that if baby does not tolerate this, may try amnioinfusion and ultimately consider cesarean delivery if not improved. All questions addressed. #GBS positive  #Persistent fever #Bilateral pyelonephritis  #Suspected triple I - Patient now febrile again to 102.1 - Additional dose of Tylenol given  - Continue Amp/Gent  - Will continue to monitor closely  [redacted]w[redacted]d, MD 3:41 AM

## 2021-10-15 NOTE — Progress Notes (Signed)
Patient taken to the NICU to visit baby via bed assisted by this RN and NT. Remained in room for approximately 20 minutes.

## 2021-10-15 NOTE — Progress Notes (Signed)
° °  PCCM Interval Progress Note  Patient asking if they can transfer back to L&D floor  Blood pressure 118/77, pulse 84, temperature 97.7 F (36.5 C), temperature source Oral, resp. rate 16, height 5\' 3"  (1.6 m), weight 80.8 kg, last menstrual period 01/15/2021, SpO2 94 %, unknown if currently breastfeeding.        Intake/Output Summary (Last 24 hours) at 10/15/2021 1744 Last data filed at 10/15/2021 1400 Gross per 24 hour  Intake 3533.67 ml  Output 2505 ml  Net 1028.67 ml   Filed Weights   10/05/21 2005 10/14/21 2138  Weight: 80.8 kg 80.8 kg   General: In bed, comfortable, no acute distress, extremities warms, cap refill less than 3 seconds N: A&O, MAE CV: NSR, RRR Pulm: Chest expansion symmetric,  GI/GU Minimal dark Lochia on pad. Abdomen rounded. Abd dressings dry and intact  Lactate 4.4>1.7 HGB  5.4>9.0 Platelets 146>133 INR 1.7 Fibrinogen 183   A/P:  Hypovolemic shock Acute blood loss anemia Thrombocytopenia S/p C section. Lactate cleared, normotensive, NSR -Continue continuous tele with SPO2 monitoring -Transfuse PRBC if HBG less than 7 -Continue q6h CBC -Monitor for signs of bleeding -FFP/PLT/Cryo per OB -Post op/birth OB care per OB  5 gravida 1 live birth 10/15/2021 -Per OB  Pyelonephritis -antibiotics per ID   Discussed with Dr. Lynetta Mare. Stable for transfer back to L&D unit.   Redmond School., MSN, APRN, AGACNP-BC [facility]  10/15/2021 , 5:44 PM  Please see [email or web address] for pager details  If no response, please call [phone] After hours, please call Elink at [phone]

## 2021-10-15 NOTE — Progress Notes (Signed)
Patient was transferred back to [facility] from  ICU as per PCCM recommendations.  She is stable, no acute concerns.  Will continue close observation, check labs in the morning. Will follow up results and manage accordingly. Routine postpartum orders placed.   Jaynie Collins, MD, FACOG Obstetrician & Gynecologist, [facility] for [employer], [facility]

## 2021-10-15 NOTE — Progress Notes (Signed)
Recurrent late decelerations noted shortly after initiation of Pitocin. Baseline fetal tachycardia in the setting of fever. Pitocin stopped. IVF bolus given. Terbutaline given. FHT subsequently improved. Will allow for fetal rest prior to initiating any additional augmentation. Discussed case with Dr. Elly Modena who agrees with plan of care.   Vilma Meckel, MD

## 2021-10-16 LAB — HEPATIC FUNCTION PANEL
ALT: 42 U/L (ref 0–44)
AST: 183 U/L — ABNORMAL HIGH (ref 15–41)
Albumin: <1.5 g/dL — ABNORMAL LOW (ref 3.5–5.0)
Alkaline Phosphatase: 92 U/L (ref 38–126)
Bilirubin, Direct: 0.3 mg/dL — ABNORMAL HIGH (ref 0.0–0.2)
Indirect Bilirubin: 0.4 mg/dL (ref 0.3–0.9)
Total Bilirubin: 0.7 mg/dL (ref 0.3–1.2)
Total Protein: 3.9 g/dL — ABNORMAL LOW (ref 6.5–8.1)

## 2021-10-16 LAB — SEDIMENTATION RATE: Sed Rate: 15 mm/hr (ref 0–22)

## 2021-10-16 LAB — PROTIME-INR
INR: 1.7 — ABNORMAL HIGH (ref 0.8–1.2)
Prothrombin Time: 20.2 seconds — ABNORMAL HIGH (ref 11.4–15.2)

## 2021-10-16 LAB — PROTEIN ELECTROPHORESIS, SERUM
A/G Ratio: 0.6 — ABNORMAL LOW (ref 0.7–1.7)
Albumin ELP: 1.9 g/dL — ABNORMAL LOW (ref 2.9–4.4)
Alpha-1-Globulin: 0.4 g/dL (ref 0.0–0.4)
Alpha-2-Globulin: 0.8 g/dL (ref 0.4–1.0)
Beta Globulin: 0.8 g/dL (ref 0.7–1.3)
Gamma Globulin: 1 g/dL (ref 0.4–1.8)
Globulin, Total: 3 g/dL (ref 2.2–3.9)
Total Protein ELP: 4.9 g/dL — ABNORMAL LOW (ref 6.0–8.5)

## 2021-10-16 LAB — BASIC METABOLIC PANEL
Anion gap: 6 (ref 5–15)
BUN: 7 mg/dL (ref 6–20)
CO2: 22 mmol/L (ref 22–32)
Calcium: 6.3 mg/dL — CL (ref 8.9–10.3)
Chloride: 105 mmol/L (ref 98–111)
Creatinine, Ser: 1.33 mg/dL — ABNORMAL HIGH (ref 0.44–1.00)
GFR, Estimated: 57 mL/min — ABNORMAL LOW (ref >60)
Glucose, Bld: 90 mg/dL (ref 70–99)
Potassium: 3.6 mmol/L (ref 3.5–5.1)
Sodium: 133 mmol/L — ABNORMAL LOW (ref 135–145)

## 2021-10-16 LAB — CBC
HCT: 23.4 % — ABNORMAL LOW (ref 36.0–46.0)
HCT: 24.5 % — ABNORMAL LOW (ref 36.0–46.0)
Hemoglobin: 7.5 g/dL — ABNORMAL LOW (ref 12.0–15.0)
Hemoglobin: 8.1 g/dL — ABNORMAL LOW (ref 12.0–15.0)
MCH: 23.7 pg — ABNORMAL LOW (ref 26.0–34.0)
MCH: 24.3 pg — ABNORMAL LOW (ref 26.0–34.0)
MCHC: 32.1 g/dL (ref 30.0–36.0)
MCHC: 33.1 g/dL (ref 30.0–36.0)
MCV: 74.1 fL — ABNORMAL LOW (ref 80.0–100.0)
MCV: 73.6 fL — ABNORMAL LOW (ref 80.0–100.0)
Platelets: 176 10*3/uL (ref 150–400)
Platelets: 209 10*3/uL (ref 150–400)
RBC: 3.16 MIL/uL — ABNORMAL LOW (ref 3.87–5.11)
RBC: 3.33 MIL/uL — ABNORMAL LOW (ref 3.87–5.11)
RDW: 26.6 % — ABNORMAL HIGH (ref 11.5–15.5)
RDW: 27 % — ABNORMAL HIGH (ref 11.5–15.5)
WBC: 15.4 10*3/uL — ABNORMAL HIGH (ref 4.0–10.5)
WBC: 15.7 10*3/uL — ABNORMAL HIGH (ref 4.0–10.5)
nRBC: 0.5 % — ABNORMAL HIGH (ref 0.0–0.2)
nRBC: 0.4 % — ABNORMAL HIGH (ref 0.0–0.2)

## 2021-10-16 LAB — URINE CULTURE: Culture: NO GROWTH

## 2021-10-16 LAB — C-REACTIVE PROTEIN: CRP: 19.4 mg/dL — ABNORMAL HIGH (ref <1.0)

## 2021-10-16 LAB — MAGNESIUM: Magnesium: 1.4 mg/dL — ABNORMAL LOW (ref 1.7–2.4)

## 2021-10-16 LAB — ALBUMIN: Albumin: <1.5 g/dL — ABNORMAL LOW (ref 3.5–5.0)

## 2021-10-16 LAB — SURGICAL PATHOLOGY

## 2021-10-16 LAB — FIBRINOGEN: Fibrinogen: 226 mg/dL (ref 210–475)

## 2021-10-16 MED ORDER — ACETAMINOPHEN 160 MG/5ML PO SOLN
650.0000 mg | Freq: Four times a day (QID) | ORAL | Status: DC
  Administered 2021-10-16 (×4): 650 mg via ORAL
  Administered 2021-10-17: 649.6 mg via ORAL
  Administered 2021-10-17 (×2): 650 mg via ORAL
  Filled 2021-10-16 (×7): qty 20.3

## 2021-10-16 MED ORDER — CALCIUM GLUCONATE-NACL 2-0.675 GM/100ML-% IV SOLN
2.0000 g | Freq: Once | INTRAVENOUS | Status: AC
  Administered 2021-10-16: 2000 mg via INTRAVENOUS
  Filled 2021-10-16: qty 100

## 2021-10-16 NOTE — Progress Notes (Signed)
Subjective: Postpartum Day 1: Cesarean Delivery Patient reports incisional pain and tolerating PO.    Objective: Vital signs in last 24 hours: Temp:  [97.7 F (36.5 C)-102.8 F (39.3 C)] 98.4 F (36.9 C) (01/12 0759) Pulse Rate:  [82-135] 99 (01/12 0759) Resp:  [9-34] 18 (01/12 0530) BP: (63-119)/(27-89) 113/66 (01/12 0759) SpO2:  [85 %-100 %] 98 % (01/12 0805)  Physical Exam:  General: alert, cooperative, and no distress Lochia: appropriate Uterine Fundus: firm Incision: no significant drainage DVT Evaluation: No evidence of DVT seen on physical exam.  Recent Labs    10/15/21 2051 10/16/21 0408  HGB 8.6* 7.5*  HCT 26.2* 23.4*    Assessment/Plan: Status post Cesarean section. Postoperative course complicated by intrapartum fever, on antibiotic, hemorrhage and  shock, s/p transfusion. Repeat CBC 1200 .  Scheryl Darter 10/16/2021, 9:35 AM

## 2021-10-16 NOTE — Progress Notes (Signed)
Patient screened out for psychosocial assessment since none of the following apply:  Psychosocial stressors documented in mother or baby's chart  Gestation less than 32 weeks  Code at delivery   Infant with anomalies Please contact the Clinical Social Worker if specific needs arise, by MOB's request, or if MOB scores greater than 9/yes to question 10 on [place] Postpartum Depression Screen.  Braedan Meuth, LCSW Clinical Social Worker [facility] Cell#: [phone]     

## 2021-10-16 NOTE — Progress Notes (Signed)
[facility] Infectious Diseases Follow Up Note  Patient Identification: Patient Name: Dawn Small MRN: [id number] [place] Date: 10/05/2021  7:14 PM Age: 26 y.o.Today's Date: 10/16/2021   Reason for Visit: fevers   Principal Problem:   Cesarean delivery delivered Active Problems:   Pyelonephritis affecting pregnancy   Back pain affecting pregnancy in third trimester   Pyelonephritis   Urinary tract infection affecting care of mother in third trimester, antepartum   FUO (fever of unknown origin)   Pleural effusion due to another disorder   Chorioamnionitis in third trimester   Antibiotics: Ceftriaxone 1/1-1/4                       Cefepime 1/5                    Cefazolin 1/6-1/7                    Gentamicin 1/10                    Ampicillin 1/10                    Unasyn 1/11-current                    Azithromycin 1/11  Lines/Hardwares: PIV  Interval Events: fever curve down-trending, leukocytosis is also downtrending. S/p PRBC transfusion. Transferred back to OB from MICU after stabilisation    Assessment Acute blood loss in the setting pf C section  E Coli complicated UTI/bilateral pyelonephritis Fevers - concerns for intra-amnionitic infection before delivery, fever curve is downtrending  Transaminitis - in the setting of shock, down-trending  Hypocalcemia and hypomagnesemia - to be repleted    Recommendations Fu blood cultures  Cotinue Unasyn, plan to switch to Augmentin if remains afebrile  Monitor Fever curve and WBC count  Transfusion and Fluid/electrolyte management per Primary  Following   Rest of the management as per the primary team. Thank you for the consult. Please page with pertinent questions or concerns. ______________________________________________________________________ Subjective patient seen and examined at the bedside. Complains of pain at the incisional site, vomiting  p.o.  Vitals BP 102/60 (BP Location: Left Arm)    Pulse 90    Temp 97.9 F (36.6 C) (Oral)    Resp 18    Ht 5\' 3"  (1.6 m)    Wt 80.8 kg    LMP 01/15/2021 (Approximate)    SpO2 99%    Breastfeeding Unknown    BMI 31.55 kg/m     Physical Exam Constitutional: Lying down in the bed    comments:   Cardiovascular:     Rate and Rhythm: Normal rate and regular rhythm.     Heart sounds:   Pulmonary:     Effort: Pulmonary effort is normal.     Comments:   Abdominal:  Post surgical dressing   Musculoskeletal:        General: Minimal swelling of bilateral lower extremities   Skin:    Comments: No lesions or rashes   Neurological:     General: No focal deficit present.   Psychiatric:        Mood and Affect: Mood normal. Calm and cooperative    Pertinent Microbiology Results for orders placed or performed during the hospital encounter of 10/05/21  OB Urine Culture     Status: Abnormal   Collection Time: 10/05/21  8:41 PM   Specimen: OB Clean Catch;  Urine  Result Value Ref Range Status   Specimen Description OB CLEAN CATCH  Final   Special Requests NONE  Final   Culture (A)  Final    >=100,000 COLONIES/mL ESCHERICHIA COLI NO GROUP B STREP (S.AGALACTIAE) ISOLATED Performed at [facility], [employer] [place] [employer] [address], [place], [place] [address]    Report Status 10/08/2021 FINAL  Final   Organism ID, Bacteria ESCHERICHIA COLI (A)  Final      Susceptibility   Escherichia coli - MIC*    AMPICILLIN <=2 SENSITIVE Sensitive     CEFAZOLIN <=4 SENSITIVE Sensitive     CEFEPIME <=0.12 SENSITIVE Sensitive     CEFTAZIDIME <=1 SENSITIVE Sensitive     CEFTRIAXONE <=0.25 SENSITIVE Sensitive     CIPROFLOXACIN <=0.25 SENSITIVE Sensitive     GENTAMICIN <=1 SENSITIVE Sensitive     IMIPENEM <=0.25 SENSITIVE Sensitive     TRIMETH/SULFA <=20 SENSITIVE Sensitive     AMPICILLIN/SULBACTAM <=2 SENSITIVE Sensitive     PIP/TAZO <=4 SENSITIVE Sensitive     * >=100,000 COLONIES/mL ESCHERICHIA COLI  Urine  Culture     Status: None   Collection Time: 10/10/21 12:14 PM   Specimen: Urine, Clean Catch  Result Value Ref Range Status   Specimen Description URINE, CLEAN CATCH  Final   Special Requests NONE  Final   Culture   Final    NO GROWTH Performed at [facility], [place] [address], [place], [place] [address]    Report Status 10/12/2021 FINAL  Final  Respiratory (~20 pathogens) panel by PCR     Status: None   Collection Time: 10/11/21  9:46 AM   Specimen: Nasopharyngeal Swab; Respiratory  Result Value Ref Range Status   Adenovirus NOT DETECTED NOT DETECTED Final   Coronavirus 229E NOT DETECTED NOT DETECTED Final    Comment: (NOTE) The Coronavirus on the Respiratory Panel, DOES NOT test for the novel  Coronavirus (2019 nCoV)    Coronavirus HKU1 NOT DETECTED NOT DETECTED Final   Coronavirus NL63 NOT DETECTED NOT DETECTED Final   Coronavirus OC43 NOT DETECTED NOT DETECTED Final   Metapneumovirus NOT DETECTED NOT DETECTED Final   Rhinovirus / Enterovirus NOT DETECTED NOT DETECTED Final   Influenza A NOT DETECTED NOT DETECTED Final   Influenza B NOT DETECTED NOT DETECTED Final   Parainfluenza Virus 1 NOT DETECTED NOT DETECTED Final   Parainfluenza Virus 2 NOT DETECTED NOT DETECTED Final   Parainfluenza Virus 3 NOT DETECTED NOT DETECTED Final   Parainfluenza Virus 4 NOT DETECTED NOT DETECTED Final   Respiratory Syncytial Virus NOT DETECTED NOT DETECTED Final   Bordetella pertussis NOT DETECTED NOT DETECTED Final   Bordetella Parapertussis NOT DETECTED NOT DETECTED Final   Chlamydophila pneumoniae NOT DETECTED NOT DETECTED Final   Mycoplasma pneumoniae NOT DETECTED NOT DETECTED Final    Comment: Performed at [facility], [place]. [address], [place], [place] [address]  Resp Panel by RT-PCR (Flu A&B, Covid) Nasopharyngeal Swab     Status: None   Collection Time: 10/13/21 10:55 AM   Specimen: Nasopharyngeal Swab; Nasopharyngeal(NP) swabs in vial transport medium  Result Value Ref  Range Status   SARS Coronavirus 2 by RT PCR NEGATIVE NEGATIVE Final    Comment: (NOTE) SARS-CoV-2 target nucleic acids are NOT DETECTED.  The SARS-CoV-2 RNA is generally detectable in upper respiratory specimens during the acute phase of infection. The lowest concentration of SARS-CoV-2 viral copies this assay can detect is 138 copies/mL. A negative result does not preclude SARS-Cov-2  infection and should not be used as the sole basis for treatment or other patient management decisions. A negative result may occur with  improper specimen collection/handling, submission of specimen other than nasopharyngeal swab, presence of viral mutation(s) within the areas targeted by this assay, and inadequate number of viral copies(<138 copies/mL). A negative result must be combined with clinical observations, patient history, and epidemiological information. The expected result is Negative.  Fact Sheet for Patients:  EntrepreneurPulse.com.au  Fact Sheet for Healthcare Providers:  IncredibleEmployment.be  This test is no t yet approved or cleared by the [place] FDA and  has been authorized for detection and/or diagnosis of SARS-CoV-2 by FDA under an Emergency Use Authorization (EUA). This EUA will remain  in effect (meaning this test can be used) for the duration of the COVID-19 declaration under Section 564(b)(1) of the Act, 21 U.S.C.section 360bbb-3(b)(1), unless the authorization is terminated  or revoked sooner.       Influenza A by PCR NEGATIVE NEGATIVE Final   Influenza B by PCR NEGATIVE NEGATIVE Final    Comment: (NOTE) The Xpert Xpress SARS-CoV-2/FLU/RSV plus assay is intended as an aid in the diagnosis of influenza from Nasopharyngeal swab specimens and should not be used as a sole basis for treatment. Nasal washings and aspirates are unacceptable for Xpert Xpress SARS-CoV-2/FLU/RSV testing.  Fact Sheet for  Patients: EntrepreneurPulse.com.au  Fact Sheet for Healthcare Providers: IncredibleEmployment.be  This test is not yet approved or cleared by the [place] FDA and has been authorized for detection and/or diagnosis of SARS-CoV-2 by FDA under an Emergency Use Authorization (EUA). This EUA will remain in effect (meaning this test can be used) for the duration of the COVID-19 declaration under Section 564(b)(1) of the Act, 21 U.S.C. section 360bbb-3(b)(1), unless the authorization is terminated or revoked.  Performed at [facility], [place] [address] [place], [place] [address]   Culture, blood (routine x 2)     Status: None (Preliminary result)   Collection Time: 10/14/21 10:44 AM   Specimen: BLOOD  Result Value Ref Range Status   Specimen Description BLOOD RIGHT ANTECUBITAL  Final   Special Requests   Final    BOTTLES DRAWN AEROBIC AND ANAEROBIC Blood Culture adequate volume   Culture   Final    NO GROWTH 1 DAY Performed at [facility], [place] [address] [place], [place] [address]    Report Status PENDING  Incomplete  Culture, blood (routine x 2)     Status: None (Preliminary result)   Collection Time: 10/14/21 10:47 AM   Specimen: BLOOD  Result Value Ref Range Status   Specimen Description BLOOD LEFT ANTECUBITAL  Final   Special Requests   Final    BOTTLES DRAWN AEROBIC AND ANAEROBIC Blood Culture adequate volume   Culture   Final    NO GROWTH 1 DAY Performed at [facility], [place] [address], [place], [place] [address]    Report Status PENDING  Incomplete    Pertinent Lab. CBC Latest Ref Rng & Units 10/16/2021 10/15/2021 10/15/2021  WBC 4.0 - 10.5 K/uL 15.4(H) 17.5(H) 20.2(H)  Hemoglobin 12.0 - 15.0 g/dL 7.5(L) 8.6(L) 9.0(L)  Hematocrit 36.0 - 46.0 % 23.4(L) 26.2(L) 27.4(L)  Platelets 150 - 400 K/uL 176 148(L) 133(L)   CMP Latest Ref Rng & Units 10/16/2021 10/14/2021 10/12/2021  Glucose 70 - 99 mg/dL 90 81 75  BUN 6 -  20 mg/dL 7 <5(L) <5(L)  Creatinine 0.44 - 1.00 mg/dL 1.33(H) 1.01(H) 0.92  Sodium 135 - 145 mmol/L 133(L) 134(L) 134(L)  Potassium 3.5 - 5.1 mmol/L 3.6 3.3(L) 3.7  Chloride 98 - 111 mmol/L 105 101 100  CO2 22 - 32 mmol/L 22 24 24   Calcium 8.9 - 10.3 mg/dL 6.3(LL) 7.0(L) 7.7(L)  Total Protein 6.5 - 8.1 g/dL - 5.1(L) -  Total Bilirubin 0.3 - 1.2 mg/dL - 0.9 -  Alkaline Phos 38 - 126 U/L - 114 -  AST 15 - 41 U/L - 379(H) -  ALT 0 - 44 U/L - 83(H) -    Pertinent Imaging today Plain films and CT images have been personally visualized and interpreted; radiology reports have been reviewed. Decision making incorporated into the Impression / Recommendations.  I spent 39  minutes for this patient encounter including review of prior medical records, coordination of care with primary/other specialist with greater than 50% of time being face to face/counseling and discussing diagnostics/treatment plan with the patient/family.  Electronically signed by:   Rosiland Oz, MD Infectious Disease Physician [facility] Pager: [phone]

## 2021-10-17 ENCOUNTER — Other Ambulatory Visit (HOSPITAL_COMMUNITY): Payer: Self-pay

## 2021-10-17 DIAGNOSIS — O2303 Infections of kidney in pregnancy, third trimester: Secondary | ICD-10-CM | POA: Diagnosis not present

## 2021-10-17 DIAGNOSIS — N12 Tubulo-interstitial nephritis, not specified as acute or chronic: Secondary | ICD-10-CM | POA: Diagnosis not present

## 2021-10-17 LAB — COMPREHENSIVE METABOLIC PANEL
ALT: 39 U/L (ref 0–44)
AST: 109 U/L — ABNORMAL HIGH (ref 15–41)
Albumin: <1.5 g/dL — ABNORMAL LOW (ref 3.5–5.0)
Alkaline Phosphatase: 120 U/L (ref 38–126)
Anion gap: 7 (ref 5–15)
BUN: 10 mg/dL (ref 6–20)
CO2: 24 mmol/L (ref 22–32)
Calcium: 7 mg/dL — ABNORMAL LOW (ref 8.9–10.3)
Chloride: 103 mmol/L (ref 98–111)
Creatinine, Ser: 1.05 mg/dL — ABNORMAL HIGH (ref 0.44–1.00)
GFR, Estimated: >60 mL/min (ref >60)
Glucose, Bld: 71 mg/dL (ref 70–99)
Potassium: 3.8 mmol/L (ref 3.5–5.1)
Sodium: 134 mmol/L — ABNORMAL LOW (ref 135–145)
Total Bilirubin: 0.5 mg/dL (ref 0.3–1.2)
Total Protein: 4.8 g/dL — ABNORMAL LOW (ref 6.5–8.1)

## 2021-10-17 LAB — CBC
HCT: 25.4 % — ABNORMAL LOW (ref 36.0–46.0)
Hemoglobin: 8.1 g/dL — ABNORMAL LOW (ref 12.0–15.0)
MCH: 24.2 pg — ABNORMAL LOW (ref 26.0–34.0)
MCHC: 31.9 g/dL (ref 30.0–36.0)
MCV: 75.8 fL — ABNORMAL LOW (ref 80.0–100.0)
Platelets: 285 10*3/uL (ref 150–400)
RBC: 3.35 MIL/uL — ABNORMAL LOW (ref 3.87–5.11)
RDW: 28 % — ABNORMAL HIGH (ref 11.5–15.5)
WBC: 15.5 10*3/uL — ABNORMAL HIGH (ref 4.0–10.5)
nRBC: 0.3 % — ABNORMAL HIGH (ref 0.0–0.2)

## 2021-10-17 LAB — CRYOGLOBULIN

## 2021-10-17 MED ORDER — AMOXICILLIN-POT CLAVULANATE 875-125 MG PO TABS
1.0000 | ORAL_TABLET | Freq: Two times a day (BID) | ORAL | 0 refills | Status: AC
  Filled 2021-10-17: qty 11, 6d supply, fill #0

## 2021-10-17 MED ORDER — NIFEDIPINE ER 30 MG PO TB24
30.0000 mg | ORAL_TABLET | Freq: Every day | ORAL | 1 refills | Status: DC
  Filled 2021-10-17: qty 30, 30d supply, fill #0

## 2021-10-17 MED ORDER — NIFEDIPINE ER OSMOTIC RELEASE 30 MG PO TB24
30.0000 mg | ORAL_TABLET | Freq: Every day | ORAL | Status: DC
  Administered 2021-10-17: 30 mg via ORAL
  Filled 2021-10-17: qty 1

## 2021-10-17 MED ORDER — OXYCODONE HCL 5 MG PO TABS
5.0000 mg | ORAL_TABLET | ORAL | 0 refills | Status: DC | PRN
  Filled 2021-10-17: qty 30, 3d supply, fill #0

## 2021-10-17 MED ORDER — AMOXICILLIN-POT CLAVULANATE 875-125 MG PO TABS
1.0000 | ORAL_TABLET | Freq: Two times a day (BID) | ORAL | Status: DC
  Administered 2021-10-17: 1 via ORAL
  Filled 2021-10-17: qty 1

## 2021-10-17 NOTE — Plan of Care (Signed)
  Problem: Education: Goal: Knowledge of condition will improve Outcome: Adequate for Discharge Goal: Individualized Educational Video(s) Outcome: Adequate for Discharge Goal: Individualized Newborn Educational Video(s) Outcome: Adequate for Discharge   Problem: Activity: Goal: Will verbalize the importance of balancing activity with adequate rest periods Outcome: Adequate for Discharge Goal: Ability to tolerate increased activity will improve Outcome: Adequate for Discharge   Problem: Coping: Goal: Ability to identify and utilize available resources and services will improve Outcome: Adequate for Discharge   Problem: Life Cycle: Goal: Chance of risk for complications during the postpartum period will decrease Outcome: Adequate for Discharge   Problem: Role Relationship: Goal: Ability to demonstrate positive interaction with newborn will improve Outcome: Adequate for Discharge   Problem: Skin Integrity: Goal: Demonstration of wound healing without infection will improve Outcome: Adequate for Discharge   

## 2021-10-17 NOTE — Progress Notes (Addendum)
[facility] Infectious Diseases Follow Up Note  Patient Identification: Patient Name: Santanna Zechman MRN: [id number] [place] Date: 10/05/2021  7:14 PM Age: 26 y.o.Today's Date: 10/17/2021   Reason for Visit: fevers   Principal Problem:   Cesarean delivery delivered Active Problems:   Pyelonephritis affecting pregnancy   Back pain affecting pregnancy in third trimester   Pyelonephritis   Urinary tract infection affecting care of mother in third trimester, antepartum   FUO (fever of unknown origin)   Pleural effusion due to another disorder   Chorioamnionitis in third trimester   Antibiotics: Ceftriaxone 1/1-1/4                       Cefepime 1/5                    Cefazolin 1/6-1/7                    Gentamicin 1/10                    Ampicillin 1/10                    Unasyn 1/11-current                    Azithromycin 1/11  Lines/Hardwares: PIV   Interval Events: afebrile for more than 24 hrs,stable leukocytosis   Assessment E Coli complicated UTI/bilateral pyelonephritis Fevers - concerns for intra-amnionitic infection before delivery, fever resolved. Blood cx 1/10 2/2 sets no growth. S/p C section - incision site looks OK per OB note Transaminitis - in the setting of shock, down-trending  AKI- donwtrending    Recommendations Will switch IV abtx to Augmentin - total 7 days Post partum care per OB ID will sign off. Please call with questions   Rest of the management as per the primary team. Thank you for the consult. Please page with pertinent questions or concerns. ______________________________________________________________________ Subjective patient seen and examined at the bedside. Denis fevers Tolerating PO Mild abdominal soreness Eager to go home    Vitals BP 132/88 (BP Location: Left Arm) Comment: post-ambulation   Pulse 97    Temp 97.8 F (36.6 C) (Oral)    Resp 18    Ht 5\' 3"  (1.6 m)    Wt 80.8 kg     LMP 01/15/2021 (Approximate)    SpO2 93%    Breastfeeding Unknown    BMI 31.55 kg/m     Physical Exam Constitutional: Lying down in the bed    comments:   Cardiovascular:     Rate and Rhythm: Normal rate and regular rhythm.     Heart sounds:   Pulmonary:     Effort: Pulmonary effort is normal.     Comments:   Abdominal:  Post surgical dressing   Musculoskeletal:        General: Minimal swelling of bilateral lower extremities   Skin:    Comments: No lesions or rashes   Neurological:     General: No focal deficit present.   Psychiatric:        Mood and Affect: Mood normal. Calm and cooperative    Pertinent Microbiology Results for orders placed or performed during the hospital encounter of 10/05/21  OB Urine Culture     Status: Abnormal   Collection Time: 10/05/21  8:41 PM   Specimen: OB Clean Catch; Urine  Result Value Ref Range Status   Specimen Description OB CLEAN  CATCH  Final   Special Requests NONE  Final   Culture (A)  Final    >=100,000 COLONIES/mL ESCHERICHIA COLI NO GROUP B STREP (S.AGALACTIAE) ISOLATED Performed at [facility], [place] [address] [place], [place] [address]    Report Status 10/08/2021 FINAL  Final   Organism ID, Bacteria ESCHERICHIA COLI (A)  Final      Susceptibility   Escherichia coli - MIC*    AMPICILLIN <=2 SENSITIVE Sensitive     CEFAZOLIN <=4 SENSITIVE Sensitive     CEFEPIME <=0.12 SENSITIVE Sensitive     CEFTAZIDIME <=1 SENSITIVE Sensitive     CEFTRIAXONE <=0.25 SENSITIVE Sensitive     CIPROFLOXACIN <=0.25 SENSITIVE Sensitive     GENTAMICIN <=1 SENSITIVE Sensitive     IMIPENEM <=0.25 SENSITIVE Sensitive     TRIMETH/SULFA <=20 SENSITIVE Sensitive     AMPICILLIN/SULBACTAM <=2 SENSITIVE Sensitive     PIP/TAZO <=4 SENSITIVE Sensitive     * >=100,000 COLONIES/mL ESCHERICHIA COLI  Urine Culture     Status: None   Collection Time: 10/10/21 12:14 PM   Specimen: Urine, Clean Catch  Result Value Ref Range Status   Specimen  Description URINE, CLEAN CATCH  Final   Special Requests NONE  Final   Culture   Final    NO GROWTH Performed at [facility], [place] [address] [place], [place] [address]    Report Status 10/12/2021 FINAL  Final  Respiratory (~20 pathogens) panel by PCR     Status: None   Collection Time: 10/11/21  9:46 AM   Specimen: Nasopharyngeal Swab; Respiratory  Result Value Ref Range Status   Adenovirus NOT DETECTED NOT DETECTED Final   Coronavirus 229E NOT DETECTED NOT DETECTED Final    Comment: (NOTE) The Coronavirus on the Respiratory Panel, DOES NOT test for the novel  Coronavirus (2019 nCoV)    Coronavirus HKU1 NOT DETECTED NOT DETECTED Final   Coronavirus NL63 NOT DETECTED NOT DETECTED Final   Coronavirus OC43 NOT DETECTED NOT DETECTED Final   Metapneumovirus NOT DETECTED NOT DETECTED Final   Rhinovirus / Enterovirus NOT DETECTED NOT DETECTED Final   Influenza A NOT DETECTED NOT DETECTED Final   Influenza B NOT DETECTED NOT DETECTED Final   Parainfluenza Virus 1 NOT DETECTED NOT DETECTED Final   Parainfluenza Virus 2 NOT DETECTED NOT DETECTED Final   Parainfluenza Virus 3 NOT DETECTED NOT DETECTED Final   Parainfluenza Virus 4 NOT DETECTED NOT DETECTED Final   Respiratory Syncytial Virus NOT DETECTED NOT DETECTED Final   Bordetella pertussis NOT DETECTED NOT DETECTED Final   Bordetella Parapertussis NOT DETECTED NOT DETECTED Final   Chlamydophila pneumoniae NOT DETECTED NOT DETECTED Final   Mycoplasma pneumoniae NOT DETECTED NOT DETECTED Final    Comment: Performed at [facility], [place]. [address] [place], [place] [address]  Resp Panel by RT-PCR (Flu A&B, Covid) Nasopharyngeal Swab     Status: None   Collection Time: 10/13/21 10:55 AM   Specimen: Nasopharyngeal Swab; Nasopharyngeal(NP) swabs in vial transport medium  Result Value Ref Range Status   SARS Coronavirus 2 by RT PCR NEGATIVE NEGATIVE Final    Comment: (NOTE) SARS-CoV-2 target nucleic acids are NOT  DETECTED.  The SARS-CoV-2 RNA is generally detectable in upper respiratory specimens during the acute phase of infection. The lowest concentration of SARS-CoV-2 viral copies this assay can detect is 138 copies/mL. A negative result does not preclude SARS-Cov-2 infection and should not be used as the sole basis for treatment or  other patient management decisions. A negative result may occur with  improper specimen collection/handling, submission of specimen other than nasopharyngeal swab, presence of viral mutation(s) within the areas targeted by this assay, and inadequate number of viral copies(<138 copies/mL). A negative result must be combined with clinical observations, patient history, and epidemiological information. The expected result is Negative.  Fact Sheet for Patients:  BloggerCourse.com  Fact Sheet for Healthcare Providers:  SeriousBroker.it  This test is no t yet approved or cleared by the [place] FDA and  has been authorized for detection and/or diagnosis of SARS-CoV-2 by FDA under an Emergency Use Authorization (EUA). This EUA will remain  in effect (meaning this test can be used) for the duration of the COVID-19 declaration under Section 564(b)(1) of the Act, 21 U.S.C.section 360bbb-3(b)(1), unless the authorization is terminated  or revoked sooner.       Influenza A by PCR NEGATIVE NEGATIVE Final   Influenza B by PCR NEGATIVE NEGATIVE Final    Comment: (NOTE) The Xpert Xpress SARS-CoV-2/FLU/RSV plus assay is intended as an aid in the diagnosis of influenza from Nasopharyngeal swab specimens and should not be used as a sole basis for treatment. Nasal washings and aspirates are unacceptable for Xpert Xpress SARS-CoV-2/FLU/RSV testing.  Fact Sheet for Patients: BloggerCourse.com  Fact Sheet for Healthcare Providers: SeriousBroker.it  This test is not yet  approved or cleared by the [place] FDA and has been authorized for detection and/or diagnosis of SARS-CoV-2 by FDA under an Emergency Use Authorization (EUA). This EUA will remain in effect (meaning this test can be used) for the duration of the COVID-19 declaration under Section 564(b)(1) of the Act, 21 U.S.C. section 360bbb-3(b)(1), unless the authorization is terminated or revoked.  Performed at [facility], [address], [place], [place] [address]   Culture, blood (routine x 2)     Status: None (Preliminary result)   Collection Time: 10/14/21 10:44 AM   Specimen: BLOOD  Result Value Ref Range Status   Specimen Description BLOOD RIGHT ANTECUBITAL  Final   Special Requests   Final    BOTTLES DRAWN AEROBIC AND ANAEROBIC Blood Culture adequate volume   Culture   Final    NO GROWTH 2 DAYS Performed at [facility], [address] [place], [place] [address]    Report Status PENDING  Incomplete  Culture, blood (routine x 2)     Status: None (Preliminary result)   Collection Time: 10/14/21 10:47 AM   Specimen: BLOOD  Result Value Ref Range Status   Specimen Description BLOOD LEFT ANTECUBITAL  Final   Special Requests   Final    BOTTLES DRAWN AEROBIC AND ANAEROBIC Blood Culture adequate volume   Culture   Final    NO GROWTH 2 DAYS Performed at [facility], [address] [place], [place] [address]    Report Status PENDING  Incomplete  Urine Culture     Status: None   Collection Time: 10/15/21 12:02 PM   Specimen: Urine, Catheterized  Result Value Ref Range Status   Specimen Description URINE, CATHETERIZED  Final   Special Requests NONE  Final   Culture   Final    NO GROWTH Performed at [facility], [address], [place], [place] [address]    Report Status 10/16/2021 FINAL  Final    Pertinent Lab. CBC Latest Ref Rng & Units 10/17/2021 10/16/2021 10/16/2021  WBC 4.0 - 10.5 K/uL 15.5(H) 15.7(H) 15.4(H)  Hemoglobin 12.0 - 15.0  g/dL 8.1(L)  8.1(L) 7.5(L)  Hematocrit 36.0 - 46.0 % 25.4(L) 24.5(L) 23.4(L)  Platelets 150 - 400 K/uL 285 209 176   CMP Latest Ref Rng & Units 10/17/2021 10/16/2021 10/14/2021  Glucose 70 - 99 mg/dL 71 90 81  BUN 6 - 20 mg/dL 10 7 <5(L)  Creatinine 0.44 - 1.00 mg/dL 1.05(H) 1.33(H) 1.01(H)  Sodium 135 - 145 mmol/L 134(L) 133(L) 134(L)  Potassium 3.5 - 5.1 mmol/L 3.8 3.6 3.3(L)  Chloride 98 - 111 mmol/L 103 105 101  CO2 22 - 32 mmol/L 24 22 24   Calcium 8.9 - 10.3 mg/dL 7.0(L) 6.3(LL) 7.0(L)  Total Protein 6.5 - 8.1 g/dL 4.8(L) 3.9(L) 5.1(L)  Total Bilirubin 0.3 - 1.2 mg/dL 0.5 0.7 0.9  Alkaline Phos 38 - 126 U/L 120 92 114  AST 15 - 41 U/L 109(H) 183(H) 379(H)  ALT 0 - 44 U/L 39 42 83(H)    Pertinent Imaging today Plain films and CT images have been personally visualized and interpreted; radiology reports have been reviewed. Decision making incorporated into the Impression / Recommendations.  I spent 35 minutes for this patient encounter including review of prior medical records, coordination of care with primary/other specialist with greater than 50% of time being face to face/counseling and discussing diagnostics/treatment plan with the patient/family.  Electronically signed by:   Rosiland Oz, MD Infectious Disease Physician [facility] Pager: [phone]

## 2021-10-18 LAB — TYPE AND SCREEN
ABO/RH(D): A POS
Antibody Screen: NEGATIVE
Unit division: 0
Unit division: 0
Unit division: 0
Unit division: 0
Unit division: 0
Unit division: 0

## 2021-10-18 LAB — BPAM RBC
Blood Product Expiration Date: 202302032359
Blood Product Expiration Date: 202302032359
Blood Product Expiration Date: 202302062359
Blood Product Expiration Date: 202302072359
Blood Product Expiration Date: 202302072359
Blood Product Expiration Date: 202302072359
ISSUE DATE / TIME: 202301110717
ISSUE DATE / TIME: 202301110717
ISSUE DATE / TIME: 202301111117
ISSUE DATE / TIME: 202301111209
Unit Type and Rh: 6200
Unit Type and Rh: 6200
Unit Type and Rh: 6200
Unit Type and Rh: 6200
Unit Type and Rh: 6200
Unit Type and Rh: 6200

## 2021-10-19 LAB — CULTURE, BLOOD (ROUTINE X 2)
Culture: NO GROWTH
Culture: NO GROWTH
Special Requests: ADEQUATE
Special Requests: ADEQUATE

## 2021-10-24 ENCOUNTER — Ambulatory Visit (INDEPENDENT_AMBULATORY_CARE_PROVIDER_SITE_OTHER): Payer: Medicaid Other | Admitting: Obstetrics and Gynecology

## 2021-10-24 ENCOUNTER — Encounter: Payer: Self-pay | Admitting: Obstetrics and Gynecology

## 2021-10-24 ENCOUNTER — Other Ambulatory Visit: Payer: Self-pay

## 2021-10-24 DIAGNOSIS — Z5189 Encounter for other specified aftercare: Secondary | ICD-10-CM

## 2021-10-24 MED ORDER — TRIAMTERENE-HCTZ 37.5-25 MG PO TABS: 1.0000 | ORAL_TABLET | Freq: Every day | ORAL | 0 refills | Status: DC

## 2021-10-24 MED ORDER — OXYCODONE-ACETAMINOPHEN 5-325 MG PO TABS: 1.0000 | ORAL_TABLET | Freq: Four times a day (QID) | ORAL | 0 refills | Status: DC | PRN

## 2021-10-24 NOTE — Patient Instructions (Signed)
Cesarean Delivery, Care After °The following information offers guidance on how to care for yourself after your procedure. Your health care provider may also give you more specific instructions. If you have problems or questions, contact your health care provider. °What can I expect after the procedure? °After the procedure, it is common to have: °A small amount of blood or clear fluid coming from the incision. °Some redness, swelling, and pain in your incision area. °Some abdominal pain and soreness. °Vaginal bleeding (lochia). Even though you did not have a vaginal delivery, you will still have vaginal bleeding and discharge. °Pelvic cramps. °Fatigue. °You may have pain, swelling, and discomfort in the tissue between your vagina and your anus (perineum) if: °Your C-section was unplanned, and you were allowed to labor and push. °An incision was made in the area (episiotomy) or the tissue tore during attempted vaginal delivery. °Follow these instructions at home: °Medicines °Take over-the-counter and prescription medicines only as told by your health care provider. °If you were prescribed an antibiotic medicine, take it as told by your health care provider. Do not stop taking the antibiotic even if you start to feel better. °Ask your health care provider if the medicine prescribed to you requires you to avoid driving or using machinery. °Incision care ° °Follow instructions from your health care provider about how to take care of your incision. Make sure you: °Wash your hands with soap and water for an least 20 seconds before and after you change your bandage (dressing). If soap and water are not available, use hand sanitizer. °If you have a dressing, change it or remove it as told by your health care provider. °Leave stitches (sutures), skin staples, skin glue, or adhesive strips in place. These skin closures may need to stay in place for 2 weeks or longer. If adhesive strip edges start to loosen and curl up, you  may trim the loose edges. Do not remove adhesive strips completely unless your health care provider tells you to do that. °Check your incision area every day for signs of infection. Check for: °More redness, swelling, or pain. °More fluid or blood. °Warmth. °Pus or a bad smell. °Do not take baths, swim, or use a hot tub until your health care provider approves. Ask your health care provider if you may take showers. °When you cough or sneeze, hug a pillow. This helps with pain and decreases the chance of your incision opening up (dehiscing). Do this until your incision heals. °Managing constipation °Your procedure may cause constipation. To prevent or treat constipation, you may need to: °Drink enough fluid to keep your urine pale yellow. °Take over-the-counter or prescription medicines. °Eat foods that are high in fiber, such as beans, whole grains, and fresh fruits and vegetables. °Limit foods that are high in fat and processed sugars, such as fried or sweet foods. °Activity ° °If possible, have someone help you care for your baby and help with household activities for at least a few days after you leave the hospital. °Rest as much as possible. Try to rest or take a nap while your baby is sleeping. °You may have to avoid lifting. Ask your health care provider how much you can safely lift. °Return to your normal activities as told by your health care provider. Ask your health care provider what activities are safe for you. °Talk with your health care provider about when you can engage in sexual activity. This may depend on your: °Risk of infection. °How fast you heal. °Comfort   and desire to engage in sexual activity. °Lifestyle °Do not drink alcohol. This is especially important if you are breastfeeding or taking pain medicine. °Do not use any products that contain nicotine or tobacco. These products include cigarettes, chewing tobacco, and vaping devices, such as e-cigarettes. If you need help quitting, ask your  health care provider. °General instructions °Do not use tampons or douches until your health care provider approves. °Wear loose, comfortable clothing and a supportive and well-fitting bra. °If you pass a blood clot, save it and call your health care provider to discuss. Do not flush blood clots down the toilet before you get instructions from your health care provider. °Keep all follow-up visits for you and your baby. This is important. °Contact a health care provider if: °You have: °A fever. °Dizziness or light-headedness. °Bad-smelling vaginal discharge. °A blood clot pass from your vagina. °Pus, blood, or a bad smell coming from your incision. °An incision that feels warm to the touch. °More redness, swelling, or pain around your incision. °Difficulty or pain when urinating. °Nausea or vomiting. °Little or no interest in activities you used to enjoy. °Your breasts turn red or become painful or hard. °You feel unusually sad or worried. °You have questions about caring for yourself or your baby. °You have redness, swelling, and pain in an arm or leg. °Get help right away if: °You have: °Pain that does not go away or get better with medicine. °Chest pain. °Trouble breathing. °Blurred vision, spots, or flashing lights in your vision. °Thoughts about hurting yourself or your baby. °New pain in your abdomen or in one of your legs. °A severe headache that does not get better with pain medicine. °You faint. °You bleed from your vagina so much that you fill more than one sanitary pad in one hour. Bleeding should not be heavier than your heaviest period. °These symptoms may be an emergency. Get help right away. Call 911. °Do not wait to see if the symptoms will go away. °Do not drive yourself to the hospital. °Get help right away if you feel like you may hurt yourself or others, or have thoughts about taking your own life. Go to your nearest emergency room or: °Call 911. °Call the National Suicide Prevention Lifeline at  1-800-273-8255 or 988. This is open 24 hours a day. °Text the Crisis Text Line at 741741. °Summary °After the procedure, it is common to have pain at your incision site, abdominal cramping, and slight bleeding from your vagina. °Check your incision area every day for signs of infection. °Tell your health care provider about any unusual symptoms. °Keep all follow-up visits for you and your baby. This is important. °This information is not intended to replace advice given to you by your health care provider. Make sure you discuss any questions you have with your health care provider. °Document Revised: 04/23/2021 Document Reviewed: 04/23/2021 °Elsevier Patient Education © 2022 [employer] ° °

## 2021-10-24 NOTE — Progress Notes (Signed)
Dawn Small presents for BP and incision check. S/P LTCS on 10/15/21. See hospital chart for additional information. Today she reports back pain. Out of pain medication. Pain medication helped with pain. Taking BP med. Has completed antibiotics. Denies any bowel or bladder dysfunction. Denies any F/C/N/V. Breast feeding  PE AF VSS Lungs clear Heart RRR Back diffuse low back pain Abd incision well healed, fundus U-2, slightly tender GU deffered Ext + 1 edema  A/P Post op incision check        BP check  Will refill Percocet. Maxzide for edema. Check labs. F/U in 2 weeks

## 2021-10-25 LAB — CBC
Hematocrit: 28.5 % — ABNORMAL LOW (ref 34.0–46.6)
Hemoglobin: 8.9 g/dL — ABNORMAL LOW (ref 11.1–15.9)
MCH: 25.6 pg — ABNORMAL LOW (ref 26.6–33.0)
MCHC: 31.2 g/dL — ABNORMAL LOW (ref 31.5–35.7)
MCV: 82 fL (ref 79–97)
NRBC: 1 % — ABNORMAL HIGH (ref 0–0)
Platelets: 597 10*3/uL — ABNORMAL HIGH (ref 150–450)
RBC: 3.48 x10E6/uL — ABNORMAL LOW (ref 3.77–5.28)
RDW: 25 % — ABNORMAL HIGH (ref 11.7–15.4)
WBC: 12.5 10*3/uL — ABNORMAL HIGH (ref 3.4–10.8)

## 2021-10-25 LAB — COMPREHENSIVE METABOLIC PANEL
ALT: 21 IU/L (ref 0–32)
AST: 26 IU/L (ref 0–40)
Albumin/Globulin Ratio: 0.7 — ABNORMAL LOW (ref 1.2–2.2)
Albumin: 2.5 g/dL — ABNORMAL LOW (ref 3.9–5.0)
Alkaline Phosphatase: 141 IU/L — ABNORMAL HIGH (ref 44–121)
BUN/Creatinine Ratio: 7 — ABNORMAL LOW (ref 9–23)
BUN: 5 mg/dL — ABNORMAL LOW (ref 6–20)
Bilirubin Total: 0.6 mg/dL (ref 0.0–1.2)
CO2: 22 mmol/L (ref 20–29)
Calcium: 7.7 mg/dL — ABNORMAL LOW (ref 8.7–10.2)
Chloride: 102 mmol/L (ref 96–106)
Creatinine, Ser: 0.68 mg/dL (ref 0.57–1.00)
Globulin, Total: 3.6 g/dL (ref 1.5–4.5)
Glucose: 70 mg/dL (ref 70–99)
Potassium: 4.3 mmol/L (ref 3.5–5.2)
Sodium: 138 mmol/L (ref 134–144)
Total Protein: 6.1 g/dL (ref 6.0–8.5)
eGFR: 123 mL/min/{1.73_m2} (ref >59)

## 2021-10-30 ENCOUNTER — Telehealth (HOSPITAL_COMMUNITY): Payer: Self-pay | Admitting: *Deleted

## 2021-10-30 NOTE — Telephone Encounter (Signed)
Attempted Hospital Discharge Follow-Up Call.  Left voice mail requesting that patient return RN's phone call.  

## 2021-11-07 ENCOUNTER — Encounter: Payer: Self-pay | Admitting: Obstetrics and Gynecology

## 2021-11-07 ENCOUNTER — Other Ambulatory Visit: Payer: Self-pay

## 2021-11-07 ENCOUNTER — Ambulatory Visit (INDEPENDENT_AMBULATORY_CARE_PROVIDER_SITE_OTHER): Payer: Medicaid Other | Admitting: Obstetrics and Gynecology

## 2021-11-07 DIAGNOSIS — D509 Iron deficiency anemia, unspecified: Secondary | ICD-10-CM

## 2021-11-07 DIAGNOSIS — O99019 Anemia complicating pregnancy, unspecified trimester: Secondary | ICD-10-CM

## 2021-11-07 NOTE — Progress Notes (Signed)
Patient ID: Dawn Small, female   DOB: 03-03-1996, 26 y.o.   MRN: [id number] Ms Schemm is here for postpartum visit. See prior office and hospital notes  Pt reports feeling better. Still tired but energy level is improving.  Tolerating diet. Denies any bowel or bladder dysfunction  Has completed Maxzide and stopped taking Procardia.  PE AF VSS Lungs clear Heart RRR Abd soft + BS incision well healed  A/P Post partum visit  Doing well. Increased ADL's as tolerates. Final PP visit in 2-3 weeks Consider repeat labs at that times.

## 2021-11-07 NOTE — Progress Notes (Signed)
3 wks postpartum Hospital course significant for admission due to suspected pyelo, then IOL and then C/S for NR FHTS and suspected chorio. States starting to feel somewhat better. Still feels weak and has stinging at incision and feels that she is having to "hunch" when standing.

## 2021-11-20 ENCOUNTER — Encounter: Payer: Self-pay | Admitting: Obstetrics and Gynecology

## 2021-11-26 ENCOUNTER — Ambulatory Visit: Payer: Medicaid Other | Admitting: Advanced Practice Midwife

## 2021-12-03 ENCOUNTER — Encounter: Payer: Self-pay | Admitting: Obstetrics and Gynecology

## 2021-12-03 ENCOUNTER — Ambulatory Visit (INDEPENDENT_AMBULATORY_CARE_PROVIDER_SITE_OTHER): Payer: Medicaid Other | Admitting: Obstetrics and Gynecology

## 2021-12-03 ENCOUNTER — Other Ambulatory Visit: Payer: Self-pay

## 2021-12-03 NOTE — Patient Instructions (Signed)

## 2021-12-03 NOTE — Progress Notes (Signed)
? ? ?  Post Partum Visit Note ? ?Dawn Small is a 26 y.o. [id number] female who presents for a postpartum visit. She is 7 week postpartum following a primary cesarean section.  I have fully reviewed the prenatal and intrapartum course. The delivery was at [redacted]wks gestational weeks.  Anesthesia: spinal. Postpartum course has been complicated by Pyelonephritis and GHTN.  Baby is doing well yes. Baby is feeding by bottle - gerber good start . Bleeding no bleeding. Bowel function is normal. Bladder function is normal. Patient is not sexually active. Contraception method is none. Postpartum depression screening: negative. ? ? ?The pregnancy intention screening data noted above was reviewed. Potential methods of contraception were discussed. The patient elected to proceed with No data recorded. ? ? ? ?There are no preventive care reminders to display for this patient. ? ?Medical record ? ?Review of Systems ?Pertinent items noted in HPI and remainder of comprehensive ROS otherwise negative. ? ?Objective:  ?There were no vitals taken for this visit.  ? ?General:  alert  ? Breasts:  not indicated  ?Lungs: clear to auscultation bilaterally  ?Heart:  regular rate and rhythm, S1, S2 normal, no murmur, click, rub or gallop  ?Abdomen: soft, non-tender; bowel sounds normal; no masses,  no organomegaly   ?Wound NA  ?GU exam:  normal  ?     ?Assessment:  ? ? There are no diagnoses linked to this encounter. ? ?NL postpartum exam.  ? ?Plan:  ? ?Essential components of care per ACOG recommendations: ? ?1.  Mood and well being: Patient with negative depression screening today. Reviewed local resources for support.  ?- Patient tobacco use? No.   ?- hx of drug use? No.   ? ?2. Infant care and feeding:  ?-Patient currently breastmilk feeding? No.  ?-Social determinants of health (SDOH) reviewed in EPIC. No concerns ? ? ?3. Sexuality, contraception and birth spacing ?- Patient does not want a pregnancy in the next year.  Desired family size is  uncertain children.  ?- Reviewed forms of contraception in tiered fashion. Patient desired condoms today.   ?- Discussed birth spacing of 18 months ? ?4. Sleep and fatigue ?-Encouraged family/partner/community support of 4 hrs of uninterrupted sleep to help with mood and fatigue ? ?5. Physical Recovery  ?- Discussed patients delivery and complications. She describes her labor as mixed. ?- Patient had a Vaginal, no problems at delivery. Perineal healing reviewed. Patient expressed understanding ?- Patient has urinary incontinence? No. ?- Patient is safe to resume physical and sexual activity ? ?6.  Health Maintenance ?- HM due items addressed Yes ?- Last pap smear  ?Diagnosis  ?Date Value Ref Range Status  ?05/02/2021   Final  ? - Negative for intraepithelial lesion or malignancy (NILM)  ? Pap smear not done at today's visit.  ?-Breast Cancer screening indicated? No.  ? ?7. Chronic Disease/Pregnancy Condition follow up: None ? ?- PCP follow up ? ?Nettie Elm, MD ?[facility] for [facility], [facility]  ?

## 2021-12-04 LAB — COMPREHENSIVE METABOLIC PANEL
ALT: 15 IU/L (ref 0–32)
AST: 16 IU/L (ref 0–40)
Albumin/Globulin Ratio: 1.6 (ref 1.2–2.2)
Albumin: 4.4 g/dL (ref 3.9–5.0)
Alkaline Phosphatase: 64 IU/L (ref 44–121)
BUN/Creatinine Ratio: 13 (ref 9–23)
BUN: 11 mg/dL (ref 6–20)
Bilirubin Total: 0.4 mg/dL (ref 0.0–1.2)
CO2: 22 mmol/L (ref 20–29)
Calcium: 9 mg/dL (ref 8.7–10.2)
Chloride: 106 mmol/L (ref 96–106)
Creatinine, Ser: 0.86 mg/dL (ref 0.57–1.00)
Globulin, Total: 2.8 g/dL (ref 1.5–4.5)
Glucose: 76 mg/dL (ref 70–99)
Potassium: 4.1 mmol/L (ref 3.5–5.2)
Sodium: 141 mmol/L (ref 134–144)
Total Protein: 7.2 g/dL (ref 6.0–8.5)
eGFR: 95 mL/min/{1.73_m2} (ref >59)

## 2021-12-04 LAB — CBC
Hematocrit: 37.5 % (ref 34.0–46.6)
Hemoglobin: 12.2 g/dL (ref 11.1–15.9)
MCH: 26.9 pg (ref 26.6–33.0)
MCHC: 32.5 g/dL (ref 31.5–35.7)
MCV: 83 fL (ref 79–97)
Platelets: 259 10*3/uL (ref 150–450)
RBC: 4.53 x10E6/uL (ref 3.77–5.28)
RDW: 16.2 % — ABNORMAL HIGH (ref 11.7–15.4)
WBC: 5.1 10*3/uL (ref 3.4–10.8)

## 2024-02-23 ENCOUNTER — Emergency Department
Admission: EM | Admit: 2024-02-23 | Discharge: 2024-02-23 | Disposition: A | Discharge status: Home / Self Care | Attending: Emergency Medicine | Admitting: Emergency Medicine

## 2024-02-23 ENCOUNTER — Other Ambulatory Visit: Payer: Self-pay

## 2024-02-23 DIAGNOSIS — L0231 Cutaneous abscess of buttock: Secondary | ICD-10-CM | POA: Insufficient documentation

## 2024-02-23 MED ORDER — LIDOCAINE HCL (PF) 1 % IJ SOLN
5.0000 mL | Freq: Once | INTRAMUSCULAR | Status: AC
  Administered 2024-02-23: 5 mL
  Filled 2024-02-23: qty 5

## 2024-02-23 MED ORDER — DOXYCYCLINE HYCLATE 100 MG PO TABS: 100.0000 mg | ORAL_TABLET | Freq: Two times a day (BID) | ORAL | 0 refills | Status: AC

## 2024-02-23 MED ORDER — DOXYCYCLINE HYCLATE 100 MG PO TABS
100.0000 mg | ORAL_TABLET | Freq: Once | ORAL | Status: AC
  Administered 2024-02-23: 100 mg via ORAL
  Filled 2024-02-23: qty 1

## 2024-02-23 MED ORDER — TRAMADOL HCL 50 MG PO TABS: 50.0000 mg | ORAL_TABLET | Freq: Two times a day (BID) | ORAL | 0 refills | Status: AC

## 2024-02-23 NOTE — Discharge Instructions (Addendum)
 Keep the wound clean, dry, and covered.  Take the antibiotic until all pills are completed.  Follow-up with your primary provider for wound check and packing removal and 3 days.  Apply warm compresses to promote healing.

## 2024-02-23 NOTE — ED Provider Notes (Signed)
 [facility] Emergency Department Provider Note     Event Date/Time   First MD Initiated Contact with Patient 02/23/24 2218     (approximate)   History   Abscess   HPI  Dawn Small is a 28 y.o. female with a noncontributory medical history, presents to the ED endorsing tenderness to the top of her left buttocks for last week.  Yesterday she noted some bloody drainage from the area today the area appears red, hard, and warm to touch.  She denies any history of the same.  No fevers, chills, or sweats reported.  Physical Exam   Triage Vital Signs: ED Triage Vitals  Encounter Vitals Group     BP 02/23/24 2128 119/74     Systolic BP Percentile --      Diastolic BP Percentile --      Pulse Rate 02/23/24 2128 (!) 115     Resp 02/23/24 2128 18     Temp 02/23/24 2128 99.1 F (37.3 C)     Temp src --      SpO2 02/23/24 2128 100 %     Weight 02/23/24 2127 170 lb (77.1 kg)     Height 02/23/24 2127 5\' 3"  (1.6 m)     Head Circumference --      Peak Flow --      Pain Score 02/23/24 2127 10     Pain Loc --      Pain Education --      Exclude from Growth Chart --     Most recent vital signs: Vitals:   02/23/24 2128  BP: 119/74  Pulse: (!) 115  Resp: 18  Temp: 99.1 F (37.3 C)  SpO2: 100%    General Awake, no distress. NAD HEENT NCAT. PERRL. EOMI. No rhinorrhea. Mucous membranes are moist.  CV:  Good peripheral perfusion.  RESP:  Normal effort.  SKIN:  Left buttocks with a area of erythema, induration, and warmth to the top of the upper inner quadrant.   ED Results / Procedures / Treatments   Labs (all labs ordered are listed, but only abnormal results are displayed) Labs Reviewed - No data to display   EKG   RADIOLOGY  No results found.   PROCEDURES:  Critical Care performed: No  .Incision and Drainage  Date/Time: 02/23/2024 11:02 PM  Performed by: May Sparks, PA-C Authorized by: May Sparks, PA-C    Consent:    Consent obtained:  Verbal   Consent given by:  Patient   Risks, benefits, and alternatives were discussed: yes     Risks discussed:  Incomplete drainage, pain and bleeding   Alternatives discussed:  Alternative treatment Universal protocol:    Site/side marked: yes     Patient identity confirmed:  Verbally with patient Location:    Type:  Abscess   Size:  4   Location:  Lower extremity   Lower extremity location:  Buttock   Buttock location:  L buttock Pre-procedure details:    Skin preparation:  Povidone-iodine Sedation:    Sedation type:  None Anesthesia:    Anesthesia method:  Local infiltration   Local anesthetic:  Lidocaine  1% w/o epi Procedure type:    Complexity:  Simple Procedure details:    Ultrasound guidance: no     Needle aspiration: no     Incision types:  Single straight   Incision depth:  Subcutaneous   Wound management:  Probed and deloculated and irrigated with saline  Drainage:  Purulent   Drainage amount:  Copious   Wound treatment:  Wound left open   Packing materials:  1/4 in iodoform gauze   Amount 1/4" iodoform:  6 Post-procedure details:    Procedure completion:  Tolerated well, no immediate complications    MEDICATIONS ORDERED IN ED: Medications  lidocaine  (PF) (XYLOCAINE ) 1 % injection 5 mL (5 mLs Infiltration Given by Other 02/23/24 2256)  doxycycline (VIBRA-TABS) tablet 100 mg (100 mg Oral Given 02/23/24 2255)     IMPRESSION / MDM / ASSESSMENT AND PLAN / ED COURSE  I reviewed the triage vital signs and the nursing notes.                              Differential diagnosis includes, but is not limited to, abscess, cellulitis, pilonidal cyst, insect bite  Patient's presentation is most consistent with acute, uncomplicated illness.  Patient's diagnosis is consistent with left buttocks cutaneous abscess and cellulitis.  Patient presents with an expanding area of erythema, induration, warmth and pain to the left upper  buttocks for the last week.  No reports of any fevers or spontaneous purulent drainage.  She consents to an I&D procedure which is performed with meaningful expression of a copious amount of purulent drainage.  The wound is packed with iodoform and cover with a nonstick sterile gauze.  Patient will be discharged home with prescriptions for doxycycline and Ultram PCP for wound check. Patient is to follow up with PCP for wound check as discussed, as needed or otherwise directed. Patient is given ED precautions to return to the ED for any worsening or new symptoms.     FINAL CLINICAL IMPRESSION(S) / ED DIAGNOSES   Final diagnoses:  Left buttock abscess     Rx / DC Orders   ED Discharge Orders     None        Note:  This document was prepared using Dragon voice recognition software and may include unintentional dictation errors.    May Sparks, PA-C 02/23/24 2313    Kandee Orion, MD 02/26/24 [phone]

## 2024-02-23 NOTE — ED Triage Notes (Signed)
 Pt reports abscess to left buttocks since last Friday, pt reports she noticed some drainage yesterday from site, today area appears red and hard.

## 2024-10-09 ENCOUNTER — Other Ambulatory Visit: Payer: Self-pay

## 2024-10-09 ENCOUNTER — Emergency Department
Admission: EM | Admit: 2024-10-09 | Discharge: 2024-10-09 | Disposition: A | Discharge status: Home / Self Care | Attending: Emergency Medicine | Admitting: Emergency Medicine

## 2024-10-09 DIAGNOSIS — B9789 Other viral agents as the cause of diseases classified elsewhere: Secondary | ICD-10-CM | POA: Diagnosis not present

## 2024-10-09 DIAGNOSIS — J988 Other specified respiratory disorders: Secondary | ICD-10-CM | POA: Diagnosis not present

## 2024-10-09 DIAGNOSIS — J45909 Unspecified asthma, uncomplicated: Secondary | ICD-10-CM | POA: Insufficient documentation

## 2024-10-09 DIAGNOSIS — R059 Cough, unspecified: Secondary | ICD-10-CM | POA: Diagnosis present

## 2024-10-09 MED ORDER — PAXLOVID (300/100) 20 X 150 MG & 10 X 100MG PO TBPK: 3.0000 | ORAL_TABLET | Freq: Two times a day (BID) | ORAL | 0 refills | Status: AC

## 2024-10-09 NOTE — ED Provider Notes (Signed)
 "  [facility] Provider Note    Event Date/Time   First MD Initiated Contact with Patient 10/09/24 [phone]     (approximate)   History   Cough   HPI  Dawn Small is a 29 y.o. female with history of asthma who presents to the emergency department with sore throat, cough, congestion.  Child with similar symptoms.  They both had a COVID-positive exposure over the weekend.  No vomiting or diarrhea.  No ear pain.  Patient reports she had a positive COVID test at home with a faint line today.   History provided by patient.    Past Medical History:  Diagnosis Date   Asthma    Cesarean delivery delivered 10/15/2021    Past Surgical History:  Procedure Laterality Date   CESAREAN SECTION N/A 10/15/2021   Procedure: CESAREAN SECTION;  Surgeon: Alger Gong, MD;  Location: [facility] LD ORS;  Service: Obstetrics;  Laterality: N/A;   WISDOM TOOTH EXTRACTION      MEDICATIONS:  Prior to Admission medications  Medication Sig Start Date End Date Taking? Authorizing Provider  Prenatal Vit-Fe Fumarate-FA (MULTIVITAMIN-PRENATAL) 27-0.8 MG TABS tablet Take 1 tablet by mouth daily at 12 noon.    [provider]    Physical Exam   Triage Vital Signs: ED Triage Vitals  Encounter Vitals Group     BP 10/09/24 0019 109/75     Girls Systolic BP Percentile --      Girls Diastolic BP Percentile --      Boys Systolic BP Percentile --      Boys Diastolic BP Percentile --      Pulse Rate 10/09/24 0019 93     Resp 10/09/24 0019 15     Temp 10/09/24 0019 (!) 97.5 F (36.4 C)     Temp Source 10/09/24 0019 Oral     SpO2 10/09/24 0019 98 %     Weight 10/09/24 0017 170 lb (77.1 kg)     Height 10/09/24 0017 5' 2 (1.575 m)     Head Circumference --      Peak Flow --      Pain Score 10/09/24 0016 0     Pain Loc --      Pain Education --      Exclude from Growth Chart --     Most recent vital signs: Vitals:   10/09/24 0019  BP: 109/75  Pulse: 93  Resp: 15   Temp: (!) 97.5 F (36.4 C)  SpO2: 98%    CONSTITUTIONAL: Alert, responds appropriately to questions. Well-appearing; well-nourished HEAD: Normocephalic, atraumatic EYES: Conjunctivae clear, pupils appear equal, sclera nonicteric ENT: normal nose; moist mucous membranes; No pharyngeal erythema or petechiae, no tonsillar hypertrophy or exudate, no uvular deviation, no unilateral swelling in posterior oropharynx, no trismus or drooling, no muffled voice, normal phonation, no stridor, airway patent. NECK: Supple, normal ROM CARD: RRR; S1 and S2 appreciated RESP: Normal chest excursion without splinting or tachypnea; breath sounds clear and equal bilaterally; no wheezes, no rhonchi, no rales, no hypoxia or respiratory distress, speaking full sentences ABD/GI: Non-distended BACK: The back appears normal EXT: Normal ROM in all joints; no deformity noted, no edema SKIN: Normal color for age and race; warm; no rash on exposed skin NEURO: Moves all extremities equally, normal speech PSYCH: The patient's mood and manner are appropriate.   ED Results / Procedures / Treatments   LABS: (all labs ordered are listed, but only abnormal results are displayed) Labs Reviewed - No data  to display   EKG:   RADIOLOGY: My personal review and interpretation of imaging:    I have personally reviewed all radiology reports.   No results found.   PROCEDURES:  Critical Care performed: No    Procedures    IMPRESSION / MDM / ASSESSMENT AND PLAN / ED COURSE  I reviewed the triage vital signs and the nursing notes.    Patient here with viral URI symptoms with positive COVID test at home.  The patient is on the cardiac monitor to evaluate for evidence of arrhythmia and/or significant heart rate changes.   DIFFERENTIAL DIAGNOSIS (includes but not limited to):   COVID-19, influenza, other viral URI, less likely pneumonia, signs of asthma exacerbation, doubt ACS, PE, pneumothorax,  CHF   Patient's presentation is most consistent with acute complicated illness / injury requiring diagnostic workup.   PLAN: Patient here with viral upper respiratory symptoms.  Patient reports having a COVID-positive test at home today.  Son with similar symptoms and has had a COVID-19 exposure over the weekend.   Discussed with patient that patient and her son likely have COVID versus another viral respiratory infection.    Unfortunately testing limited at this time to patients being admitted to the hospital due to high volumes of respiratory patients recently and limited supply of testing.  Given patient's history of asthma with positive COVID test at home, I have offered her prescription for Paxlovid  which she agrees to.  Outside of treatment window for Tamiflu if she was influenza positive.  Discussed supportive care instructions.  Lungs are clear to auscultation today.  No indication for antibiotics, steroids.  Will provide with work note.   MEDICATIONS GIVEN IN ED: Medications - No data to display   ED COURSE:  At this time, I do not feel there is any life-threatening condition present. I reviewed all nursing notes, vitals, pertinent previous records.  All lab and urine results, EKGs, imaging ordered have been independently reviewed and interpreted by myself.  I reviewed all available radiology reports from any imaging ordered this visit.  Based on my assessment, I feel the patient is safe to be discharged home without further emergent workup and can continue workup as an outpatient as needed. Discussed all findings, treatment plan as well as usual and customary return precautions.  They verbalize understanding and are comfortable with this plan.  Outpatient follow-up has been provided as needed.  All questions have been answered.    CONSULTS:  none   OUTSIDE RECORDS REVIEWED: Reviewed most recent OB/GYN notes.       FINAL CLINICAL IMPRESSION(S) / ED DIAGNOSES   Final  diagnoses:  Viral respiratory infection     Rx / DC Orders   ED Discharge Orders          Ordered    nirmatrelvir/ritonavir (PAXLOVID , 300/100,) 20 x 150 MG & 10 x 100MG  TBPK  2 times daily        10/09/24 0107             Note:  This document was prepared using Dragon voice recognition software and may include unintentional dictation errors.   Shemiah Rosch, Josette SAILOR, DO 10/09/24 0116  "

## 2024-10-09 NOTE — ED Triage Notes (Signed)
 Pt reports concern following COVID exposure. Has had a productive clear cough and runny nose since Saturday.

## 2024-10-09 NOTE — Discharge Instructions (Addendum)
 You may take over-the-counter Tylenol  1000 mg every 6 hours as needed for fever, pain.  You may alternate this with over-the-counter ibuprofen 800 mg every 6 hours as needed for fever, pain.  Please take ibuprofen with food.  You likely have COVID-19 given recent positive exposure and positive test at home.  We have given you prescription for Paxlovid  that can help prevent severe illness given your history of asthma.  You may use over-the-counter Flonase, Zyrtec to help with congestion.  You may also use Afrin twice daily for nasal congestion.  Please do not use this for more than 3 days in a row as it can cause worsening congestion.  Nasal saline over-the-counter can also help with congestion.  You may take over-the-counter guaifenesin, dextromethorphan as needed for cough.  Honey has also been shown to help with cough.  You may use Chloraseptic spray, throat lozenges and warm salt water  gargles several times a day to help with sore throat.  Please note that many over-the-counter medications have multiple medications in them such as DayQuil, NyQuil, cold and flu medications.  Please make sure you are not taking duplicates of any medication.  I recommend rest, increase fluid intake.  Please avoid others until you have been fever free for 24 hours without using Tylenol  or ibuprofen.
